# Patient Record
Sex: Female | Born: 1985 | Race: Black or African American | Hispanic: No | Marital: Single | Smoking: Former smoker
Health system: Southern US, Community
[De-identification: ages and names within clinical notes are randomized; demographics above are authoritative.]

## PROBLEM LIST (undated history)

## (undated) DIAGNOSIS — F319 Bipolar disorder, unspecified: Secondary | ICD-10-CM

## (undated) DIAGNOSIS — E669 Obesity, unspecified: Secondary | ICD-10-CM

## (undated) DIAGNOSIS — I10 Essential (primary) hypertension: Secondary | ICD-10-CM

## (undated) DIAGNOSIS — E66811 Obesity, class 1: Secondary | ICD-10-CM

## (undated) HISTORY — DX: Bipolar disorder, unspecified: F31.9

## (undated) HISTORY — DX: Essential (primary) hypertension: I10

---

## 2012-03-18 ENCOUNTER — Emergency Department: Payer: Self-pay | Admitting: Emergency Medicine

## 2012-05-28 ENCOUNTER — Emergency Department: Payer: Self-pay | Admitting: Emergency Medicine

## 2015-09-17 ENCOUNTER — Encounter: Payer: Self-pay | Admitting: Family Medicine

## 2015-09-17 ENCOUNTER — Ambulatory Visit (INDEPENDENT_AMBULATORY_CARE_PROVIDER_SITE_OTHER): Payer: BLUE CROSS/BLUE SHIELD | Admitting: Family Medicine

## 2015-09-17 VITALS — BP 140/92 | HR 76 | Ht 66.25 in | Wt 389.2 lb

## 2015-09-17 DIAGNOSIS — Z0000 Encounter for general adult medical examination without abnormal findings: Secondary | ICD-10-CM

## 2015-09-17 DIAGNOSIS — R030 Elevated blood-pressure reading, without diagnosis of hypertension: Secondary | ICD-10-CM

## 2015-09-17 DIAGNOSIS — IMO0001 Reserved for inherently not codable concepts without codable children: Secondary | ICD-10-CM

## 2015-09-17 DIAGNOSIS — F329 Major depressive disorder, single episode, unspecified: Secondary | ICD-10-CM

## 2015-09-17 DIAGNOSIS — M722 Plantar fascial fibromatosis: Secondary | ICD-10-CM | POA: Diagnosis not present

## 2015-09-17 DIAGNOSIS — Z124 Encounter for screening for malignant neoplasm of cervix: Secondary | ICD-10-CM | POA: Diagnosis not present

## 2015-09-17 DIAGNOSIS — F32A Depression, unspecified: Secondary | ICD-10-CM

## 2015-09-17 LAB — POCT URINALYSIS DIPSTICK
Bilirubin, UA: NEGATIVE
Blood, UA: NEGATIVE
GLUCOSE UA: NEGATIVE
KETONES UA: NEGATIVE
Leukocytes, UA: NEGATIVE
Nitrite, UA: NEGATIVE
Protein, UA: NEGATIVE
SPEC GRAV UA: 1.015
Urobilinogen, UA: NEGATIVE
pH, UA: 7.5

## 2015-09-17 NOTE — Patient Instructions (Signed)
As discussed, we will refer you to Gynecology for pap smear. Continue seeing your nutritionist. For your foot, use ice and stretching and anti-inflamatories as discussed as this appear to be plantar fascitis.   Preventative Care for Adults - Female      MAINTAIN REGULAR HEALTH EXAMS:  A routine yearly physical is a good way to check in with your primary care provider about your health and preventive screening. It is also an opportunity to share updates about your health and any concerns you have, and receive a thorough all-over exam.   Most health insurance companies pay for at least some preventative services.  Check with your health plan for specific coverages.  WHAT PREVENTATIVE SERVICES DO WOMEN NEED?  Adult women should have their weight and blood pressure checked regularly.   Women age 27 and older should have their cholesterol levels checked regularly.  Women should be screened for cervical cancer with a Pap smear and pelvic exam beginning at either age 27, or 3 years after they become sexually activity.    Breast cancer screening generally begins at age 27 with a mammogram and breast exam by your primary care provider.    Beginning at age 650 and continuing to age 27, women should be screened for colorectal cancer.  Certain people may need continued testing until age 27.  Updating vaccinations is part of preventative care.  Vaccinations help protect against diseases such as the flu.  Osteoporosis is a disease in which the bones lose minerals and strength as we age. Women ages 5465 and over should discuss this with their caregivers, as should women after menopause who have other risk factors.  Lab tests are generally done as part of preventative care to screen for anemia and blood disorders, to screen for problems with the kidneys and liver, to screen for bladder problems, to check blood sugar, and to check your cholesterol level.  Preventative services generally include counseling  about diet, exercise, avoiding tobacco, drugs, excessive alcohol consumption, and sexually transmitted infections.    GENERAL RECOMMENDATIONS FOR GOOD HEALTH:  Healthy diet:  Eat a variety of foods, including fruit, vegetables, animal or vegetable protein, such as meat, fish, chicken, and eggs, or beans, lentils, tofu, and grains, such as rice.  Drink plenty of water daily.  Decrease saturated fat in the diet, avoid lots of red meat, processed foods, sweets, fast foods, and fried foods.  Exercise:  Aerobic exercise helps maintain good heart health. At least 30-40 minutes of moderate-intensity exercise is recommended. For example, a brisk walk that increases your heart rate and breathing. This should be done on most days of the week.   Find a type of exercise or a variety of exercises that you enjoy so that it becomes a part of your daily life.  Examples are running, walking, swimming, water aerobics, and biking.  For motivation and support, explore group exercise such as aerobic class, spin class, Zumba, Yoga,or  martial arts, etc.    Set exercise goals for yourself, such as a certain weight goal, walk or run in a race such as a 5k walk/run.  Speak to your primary care provider about exercise goals.  Disease prevention:  If you smoke or chew tobacco, find out from your caregiver how to quit. It can literally save your life, no matter how long you have been a tobacco user. If you do not use tobacco, never begin.   Maintain a healthy diet and normal weight. Increased weight leads to problems with  blood pressure and diabetes.   The Body Mass Index or BMI is a way of measuring how much of your body is fat. Having a BMI above 27 increases the risk of heart disease, diabetes, hypertension, stroke and other problems related to obesity. Your caregiver can help determine your BMI and based on it develop an exercise and dietary program to help you achieve or maintain this important measurement at a  healthful level.  High blood pressure causes heart and blood vessel problems.  Persistent high blood pressure should be treated with medicine if weight loss and exercise do not work.   Fat and cholesterol leaves deposits in your arteries that can block them. This causes heart disease and vessel disease elsewhere in your body.  If your cholesterol is found to be high, or if you have heart disease or certain other medical conditions, then you may need to have your cholesterol monitored frequently and be treated with medication.   Ask if you should have a cardiac stress test if your history suggests this. A stress test is a test done on a treadmill that looks for heart disease. This test can find disease prior to there being a problem.  Menopause can be associated with physical symptoms and risks. Hormone replacement therapy is available to decrease these. You should talk to your caregiver about whether starting or continuing to take hormones is right for you.   Osteoporosis is a disease in which the bones lose minerals and strength as we age. This can result in serious bone fractures. Risk of osteoporosis can be identified using a bone density scan. Women ages 63 and over should discuss this with their caregivers, as should women after menopause who have other risk factors. Ask your caregiver whether you should be taking a calcium supplement and Vitamin D, to reduce the rate of osteoporosis.   Avoid drinking alcohol in excess (more than two drinks per day).  Avoid use of street drugs. Do not share needles with anyone. Ask for professional help if you need assistance or instructions on stopping the use of alcohol, cigarettes, and/or drugs.  Brush your teeth twice a day with fluoride toothpaste, and floss once a day. Good oral hygiene prevents tooth decay and gum disease. The problems can be painful, unattractive, and can cause other health problems. Visit your dentist for a routine oral and dental check  up and preventive care every 6-12 months.   Look at your skin regularly.  Use a mirror to look at your back. Notify your caregivers of changes in moles, especially if there are changes in shapes, colors, a size larger than a pencil eraser, an irregular border, or development of new moles.  Safety:  Use seatbelts 100% of the time, whether driving or as a passenger.  Use safety devices such as hearing protection if you work in environments with loud noise or significant background noise.  Use safety glasses when doing any work that could send debris in to the eyes.  Use a helmet if you ride a bike or motorcycle.  Use appropriate safety gear for contact sports.  Talk to your caregiver about gun safety.  Use sunscreen with a SPF (or skin protection factor) of 15 or greater.  Lighter skinned people are at a greater risk of skin cancer. Don't forget to also wear sunglasses in order to protect your eyes from too much damaging sunlight. Damaging sunlight can accelerate cataract formation.   Practice safe sex. Use condoms. Condoms are used for birth  control and to help reduce the spread of sexually transmitted infections (or STIs).  Some of the STIs are gonorrhea (the clap), chlamydia, syphilis, trichomonas, herpes, HPV (human papilloma virus) and HIV (human immunodeficiency virus) which causes AIDS. The herpes, HIV and HPV are viral illnesses that have no cure. These can result in disability, cancer and death.   Keep carbon monoxide and smoke detectors in your home functioning at all times. Change the batteries every 6 months or use a model that plugs into the wall.   Vaccinations:  Stay up to date with your tetanus shots and other required immunizations. You should have a booster for tetanus every 10 years. Be sure to get your flu shot every year, since 5%-20% of the [place] population comes down with the flu. The flu vaccine changes each year, so being vaccinated once is not enough. Get your shot in the fall,  before the flu season peaks.   Other vaccines to consider:  Human Papilloma Virus or HPV causes cancer of the cervix, and other infections that can be transmitted from person to person. There is a vaccine for HPV, and females should get immunized between the ages of 6 and 36. It requires a series of 3 shots.   Pneumococcal vaccine to protect against certain types of pneumonia.  This is normally recommended for adults age 53 or older.  However, adults younger than 27 years old with certain underlying conditions such as diabetes, heart or lung disease should also receive the vaccine.  Shingles vaccine to protect against Varicella Zoster if you are older than age 45, or younger than 27 years old with certain underlying illness.  Hepatitis A vaccine to protect against a form of infection of the liver by a virus acquired from food.  Hepatitis B vaccine to protect against a form of infection of the liver by a virus acquired from blood or body fluids, particularly if you work in health care.  If you plan to travel internationally, check with your local health department for specific vaccination recommendations.  Cancer Screening:  Breast cancer screening is essential to preventive care for women. All women age 12 and older should perform a breast self-exam every month. At age 30 and older, women should have their caregiver complete a breast exam each year. Women at ages 60 and older should have a mammogram (x-ray film) of the breasts. Your caregiver can discuss how often you need mammograms.    Cervical cancer screening includes taking a Pap smear (sample of cells examined under a microscope) from the cervix (end of the uterus). It also includes testing for HPV (Human Papilloma Virus, which can cause cervical cancer). Screening and a pelvic exam should begin at age 60, or 3 years after a woman becomes sexually active. Screening should occur every year, with a Pap smear but no HPV testing, up to age 84.  After age 51, you should have a Pap smear every 3 years with HPV testing, if no HPV was found previously.   Most routine colon cancer screening begins at the age of 51. On a yearly basis, doctors may provide special easy to use take-home tests to check for hidden blood in the stool. Sigmoidoscopy or colonoscopy can detect the earliest forms of colon cancer and is life saving. These tests use a small camera at the end of a tube to directly examine the colon. Speak to your caregiver about this at age 43, when routine screening begins (and is repeated every 5 years unless  early forms of pre-cancerous polyps or small growths are found).

## 2015-09-17 NOTE — Progress Notes (Signed)
Subjective:    Patient ID: Dorothy Pierce, female    DOB: [birth year], 29 y.o.   MRN: [id number]  HPI She is new to the practice and here to establish primary care. She has not had a physical exam in at least 2 years. She states she knows she is obese- signed up for nutritionist at [facility], first appt next week. Uses [employer] and has fitbit. Trying to work on calorie intake. Walking and doing treadmill.  Denies ever having high blood pressure.  Has Bipolar disorder, diagnosed in 2012 by [facility] provider. States she has more depression than mania. Has taken Lamictal in past and stopped in 2014 because she didn't like the way it made her feel. Has seen a counselor in the past but no longer. States she takes [employer] and is stable. She states she is able to control mood swings, knows when they are coming on.  Complains of 1 week history right foot pain when she gets up in morning and starts walking on it and eases up throughout the day. She has been taking Tylenol 2-3 pills daily for pain and this is helping. Reports similar problem 2 years ago and was diagnosed with plantar fascitis.  No other complaints.  Pap smear- 3-4 years ago and normal per patient. LMP: Oct 3. Regular and lasts 2 1/2-3 days, not heavy. Has cramping and sweats, no nausea, vomiting.  Never been pregnant.  Has had 1 sexual partner this year. She has same sex partner.   Dentist regular Eyes: no glasses or lenses. Wants to make appointment.  Immunizations- not on file, thinks last tetanus 7 years ago. Does not get flu shot.   Review of Systems Review of Systems Constitutional: -fever, -chills, -sweats, -unexpected weight change,-fatigue ENT: -runny nose, -ear pain, -sore throat Cardiology:  -chest pain, -palpitations, -edema Respiratory: -cough, -shortness of breath, -wheezing Gastroenterology: -abdominal pain, -nausea, -vomiting, -diarrhea, -constipation  Hematology: -bleeding or bruising  problems Musculoskeletal: -arthralgias, -myalgias, -joint swelling, -back pain Ophthalmology: -vision changes Urology: -dysuria, -difficulty urinating, -hematuria, -urinary frequency, -urgency Neurology: -headache, -weakness, -tingling, -numbness       Objective:   Physical Exam  BP 140/92 mmHg  Pulse 76  Ht 5' 6.25" (1.683 m)  Wt 389 lb 3.2 oz (176.54 kg)  BMI 62.33 kg/m2  LMP 09/07/2015  General Appearance:    Alert, cooperative, no distress, appears stated age  Head:    Normocephalic, without obvious abnormality, atraumatic  Eyes:    PERRL, conjunctiva/corneas clear, EOM's intact, fundi    benign  Ears:    Normal TM's and external ear canals  Nose:   Nares normal, mucosa normal, no drainage or sinus   tenderness  Throat:   Lips, mucosa, and tongue normal; teeth and gums normal  Neck:   Supple, no lymphadenopathy;  thyroid:  no   enlargement/tenderness/nodules; no carotid   bruit    Back:    Spine nontender, no curvature, ROM normal, no CVA     tenderness  Lungs:     Clear to auscultation bilaterally without wheezes, rales or     ronchi; respirations unlabored  Chest Wall:    No tenderness or deformity   Heart:    Regular rate and rhythm, S1 and S2 normal, no murmur, rub   or gallop  Breast Exam:    No tenderness, masses, or nipple discharge or inversion.      No axillary lymphadenopathy  Abdomen:     Soft, non-tender, nondistended, normoactive bowel  sounds,    no masses, no hepatosplenomegaly  Genitalia:    Normal external genitalia without lesions.  BUS and vagina normal;  No abnormal vaginal discharge.  Uterus and adnexa not enlarged, nontender, no masses.  Pap- unable to perform, could not visualize cervix.  Rectal:    Not performed due to age<40 and no related complaints  Extremities:   No clubbing, cyanosis or edema  Pulses:   2+ and symmetric all extremities  Skin:   Skin color, texture, turgor normal, no rashes or lesions  Lymph nodes:   Cervical, supraclavicular,  and axillary nodes normal  Neurologic:   CNII-XII intact, normal strength, sensation and gait; reflexes 2+ and symmetric throughout          Psych:   Normal mood, affect, hygiene and grooming.     Urinalysis dipstick negative      Assessment & Plan:  Routine general medical examination at a health care facility - Plan: POCT urinalysis dipstick, Comprehensive metabolic panel, CBC with Differential/Platelet, TSH, Lipid panel, Visual acuity screening, CANCELED: Cytology - PAP  Morbid obesity, unspecified obesity type (HCC)  Screening for cervical cancer - Plan: Ambulatory referral to Gynecology, CANCELED: Cytology - PAP  Elevated blood pressure  Plantar fasciitis  Depression  She will be referred for a pap smear since I was unable to visualize her cervix with the largest speculum in the office. Discussed that we need to keep an eye on her blood pressure and encouraged her to return for a follow-up blood pressure check and fasting labs. Discussed in depth that her BMI places her in morbid obesity category. She has an appointment with a nutritionist and I strongly encouraged her to keep this appointment and to continue on her weight loss goal and to make sure she is getting a minimum of 150 minutes per week of physical activity. Discussed that we can refer her to a counselor if she would like. She states she feels stable currently but will let me know in the future if she decides to do this.  Instructions on plantar fasciitis and treatment given.

## 2015-09-20 ENCOUNTER — Telehealth: Payer: Self-pay | Admitting: Obstetrics and Gynecology

## 2015-09-20 ENCOUNTER — Other Ambulatory Visit: Payer: BLUE CROSS/BLUE SHIELD

## 2015-09-20 NOTE — Telephone Encounter (Signed)
Called and left a message for patient to call back to schedule a new patient doctor referral. °

## 2015-09-21 NOTE — Telephone Encounter (Signed)
Called and left a message for patient to call back to schedule a new patient doctor referral. °

## 2015-09-23 NOTE — Telephone Encounter (Signed)
Called and left a message for patient to call back to schedule a new patient doctor referral. °

## 2015-09-24 NOTE — Telephone Encounter (Signed)
Sending referral back to referring office due to patient not returning several calls to schedule.

## 2015-09-30 ENCOUNTER — Other Ambulatory Visit: Payer: BLUE CROSS/BLUE SHIELD

## 2015-09-30 ENCOUNTER — Encounter: Payer: Self-pay | Admitting: Family Medicine

## 2015-09-30 ENCOUNTER — Telehealth: Payer: Self-pay

## 2015-09-30 DIAGNOSIS — Z0000 Encounter for general adult medical examination without abnormal findings: Secondary | ICD-10-CM

## 2015-09-30 LAB — CBC WITH DIFFERENTIAL/PLATELET
BASOS ABS: 0 10*3/uL (ref 0.0–0.1)
BASOS PCT: 0 % (ref 0–1)
EOS ABS: 0.1 10*3/uL (ref 0.0–0.7)
Eosinophils Relative: 1 % (ref 0–5)
HCT: 36.6 % (ref 36.0–46.0)
Hemoglobin: 12.4 g/dL (ref 12.0–15.0)
Lymphocytes Relative: 30 % (ref 12–46)
Lymphs Abs: 2.8 10*3/uL (ref 0.7–4.0)
MCH: 31.9 pg (ref 26.0–34.0)
MCHC: 33.9 g/dL (ref 30.0–36.0)
MCV: 94.1 fL (ref 78.0–100.0)
MONOS PCT: 5 % (ref 3–12)
MPV: 9.1 fL (ref 8.6–12.4)
Monocytes Absolute: 0.5 10*3/uL (ref 0.1–1.0)
NEUTROS PCT: 64 % (ref 43–77)
Neutro Abs: 6 10*3/uL (ref 1.7–7.7)
PLATELETS: 318 10*3/uL (ref 150–400)
RBC: 3.89 MIL/uL (ref 3.87–5.11)
RDW: 13.3 % (ref 11.5–15.5)
WBC: 9.3 10*3/uL (ref 4.0–10.5)

## 2015-09-30 LAB — COMPREHENSIVE METABOLIC PANEL
ALBUMIN: 3.5 g/dL — AB (ref 3.6–5.1)
ALT: 15 U/L (ref 6–29)
AST: 13 U/L (ref 10–30)
Alkaline Phosphatase: 65 U/L (ref 33–115)
BILIRUBIN TOTAL: 0.6 mg/dL (ref 0.2–1.2)
BUN: 10 mg/dL (ref 7–25)
CALCIUM: 9.1 mg/dL (ref 8.6–10.2)
CHLORIDE: 106 mmol/L (ref 98–110)
CO2: 25 mmol/L (ref 20–31)
CREATININE: 0.76 mg/dL (ref 0.50–1.10)
Glucose, Bld: 84 mg/dL (ref 65–99)
Potassium: 4.1 mmol/L (ref 3.5–5.3)
SODIUM: 140 mmol/L (ref 135–146)
TOTAL PROTEIN: 7 g/dL (ref 6.1–8.1)

## 2015-09-30 LAB — LIPID PANEL
CHOLESTEROL: 129 mg/dL (ref 125–200)
HDL: 43 mg/dL — AB (ref >=46)
LDL Cholesterol: 76 mg/dL (ref <130)
Total CHOL/HDL Ratio: 3 Ratio (ref <=5.0)
Triglycerides: 52 mg/dL (ref <150)
VLDL: 10 mg/dL (ref <30)

## 2015-09-30 LAB — TSH: TSH: 1.652 u[IU]/mL (ref 0.350–4.500)

## 2015-09-30 NOTE — Telephone Encounter (Signed)
Pt came for lab visit and had BP checked before she left, it was high: 146/96

## 2015-09-30 NOTE — Telephone Encounter (Signed)
Please call her and tell her that her blood pressure was high today at her visit. Also, see if she has a blood pressure cuff at home. If not, I recommend that she get a good one and keep an eye on her blood pressure at home. Please give her instructions on how to take her BP at home. * I would like for her to let me know if her blood pressures are higher than 140/90*. In the meanwhile, please have her watch her salt and fried food intake. Have her follow up with me if her home blood pressures are elevated. It may be time to start her on a medication for this.  Thanks.

## 2015-09-30 NOTE — Telephone Encounter (Signed)
Left message for pt to call me back  Pt needs an appt for her blood pressure per vickie

## 2015-10-01 NOTE — Telephone Encounter (Signed)
Left message for pt to call back and schedule an appt for her high blood pressure with vickie

## 2015-10-14 ENCOUNTER — Encounter: Payer: Self-pay | Admitting: Internal Medicine

## 2015-12-09 ENCOUNTER — Ambulatory Visit (INDEPENDENT_AMBULATORY_CARE_PROVIDER_SITE_OTHER): Payer: BLUE CROSS/BLUE SHIELD | Admitting: Family Medicine

## 2015-12-09 ENCOUNTER — Encounter: Payer: Self-pay | Admitting: Family Medicine

## 2015-12-09 ENCOUNTER — Ambulatory Visit
Admission: RE | Admit: 2015-12-09 | Discharge: 2015-12-09 | Disposition: A | Discharge status: Home / Self Care | Payer: BLUE CROSS/BLUE SHIELD | Source: Ambulatory Visit | Attending: Family Medicine | Admitting: Family Medicine

## 2015-12-09 VITALS — BP 140/90 | HR 64 | Ht 66.25 in | Wt 399.2 lb

## 2015-12-09 DIAGNOSIS — R079 Chest pain, unspecified: Secondary | ICD-10-CM

## 2015-12-09 DIAGNOSIS — R0601 Orthopnea: Secondary | ICD-10-CM

## 2015-12-09 DIAGNOSIS — I10 Essential (primary) hypertension: Secondary | ICD-10-CM | POA: Diagnosis not present

## 2015-12-09 MED ORDER — HYDROCHLOROTHIAZIDE 12.5 MG PO TABS: 12.5000 mg | ORAL_TABLET | Freq: Every day | ORAL | Status: DC

## 2015-12-09 MED ORDER — ESOMEPRAZOLE MAGNESIUM 20 MG PO CPDR: 20.0000 mg | DELAYED_RELEASE_CAPSULE | Freq: Every day | ORAL | Status: DC

## 2015-12-09 NOTE — Progress Notes (Signed)
Subjective:    Patient ID: Dorothy Pierce, female    DOB: 04-20-1986, 30 y.o.   MRN: [id number]  HPI Chief Complaint  Patient presents with  . upper chest pain    upper right chest pain since tuesday. was having irrialation on other side but then moved to right side   She is here with reports of non radiating chest pain that she describes as a "scratchy" sensation for past 2 nights. She denies having pain at present. Pain does not stay in one spot, sometimes it is on the right side and other times on the left. Pain is only present when she lays down and it occasionally wakes her up at night. Pain lasts until she gets up and then goes away when she is upright and moving around. She states she has also been feeling uncomfortable when laying flat and states she feels like she has a hard time getting a deep breath so she has been propping herself up on 2 pillows. She denies having pain or shortness of breath during the daytime.  States swelling to bilateral ankles that get worse at the end of the day and then goes away completely over night.  Denies palpitations, heart racing, DOE, fever, chills, cough, nausea, vomiting, diarrhea.  She is concerned about weight gain, she has gained 10 lbs since last visit in October. States she has been eating more because of being stressed. 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 She is no longer in a relationship, states she and her partner broke up.  She did not go for pap smear as recommended.   She graduating this month from [employer] with [employer].    Review of Systems Pertinent positives and negatives in the history of present illness.    Objective:   Physical Exam  Constitutional: She is oriented to person, place, and time. She appears well-developed and well-nourished. No distress.  Morbidly obese  Neck: Normal range of motion and full passive range of motion without pain. Neck supple. No JVD present. Carotid bruit is not present.  Cardiovascular: Normal  rate, regular rhythm, normal heart sounds and intact distal pulses.  Exam reveals no gallop and no friction rub.   No murmur heard. No LE edema bilaterally  Pulmonary/Chest: Effort normal and breath sounds normal. She exhibits no tenderness and no swelling.  Lymphadenopathy:  No head, cervical or supraclavicular adenopathy  Neurological: She is alert and oriented to person, place, and time. Gait normal.  Skin: Skin is warm and dry. No rash noted. No cyanosis. No pallor.  Psychiatric: She has a normal mood and affect. Her speech is normal and behavior is normal. Judgment and thought content normal. Cognition and memory are normal.   BP 140/84 mmHg  Pulse 64  Ht 5' 6.25" (1.683 m)  Wt 399 lb 3.2 oz (181.076 kg)  BMI 63.93 kg/m2  LMP 11/08/2015   EKG- NSR     Assessment & Plan:  Nonspecific chest pain - Plan: EKG 12-Lead, DG Chest 2 View  Morbid obesity, unspecified obesity type (HCC) - Plan: Amb ref to Medical Nutrition Therapy-MNT  Orthopnea - Plan: DG Chest 2 View  Essential hypertension - Plan: hydrochlorothiazide (HYDRODIURIL) 12.5 MG tablet, Amb ref to Medical Nutrition Therapy-MNT  Discussed patient with Dr Susann Givens. Chest pain appears non specific and only occurs when lying flat which speaks to possible reflux. Samples of Nexium given with instructions to see if she notices a difference. Discussed increase in weight and that her BMI places  her in severely obese category. Referral made to nutritionist. Discussed in depth lifestyle changes and finding a different way of dealing with her stress than eating such as taking 10 deep breaths, calling a friend, drinking water, etc. Will consider medication such as Contrave after she has seen nutritionist. Discussed walking 10-15 minutes 2 times daily. 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  Blood pressure has been elevated on 2 separate encounters and today at the beginning and end of appointment. She did not follow  up 3 months ago for blood pressure as requested. Started her on HCTZ today and discussed using caution for the first few days when taking it until she sees how her body reacts to it. Encouraged her to check her blood pressure daily for next week. She will follow up next week for blood pressure check and to see how she is doing on new medication.  Discussed DASH diet and written information provided.  Recommend that she schedule with Gynecologist to have pap smear. I was unable to reach her cervix with our speculums due to her body habitus.

## 2015-12-09 NOTE — Patient Instructions (Addendum)
Try the Nexium one pill once daily and take it 30 minutes before eating. Make an appointment to get your Pap smear. You should hear from the nutritionist in the next few days if you have not heard from them let me know. Return in 1 week for a blood pressure check.  DASH Eating Plan DASH stands for "Dietary Approaches to Stop Hypertension." The DASH eating plan is a healthy eating plan that has been shown to reduce high blood pressure (hypertension). Additional health benefits may include reducing the risk of type 2 diabetes mellitus, heart disease, and stroke. The DASH eating plan may also help with weight loss. WHAT DO I NEED TO KNOW ABOUT THE DASH EATING PLAN? For the DASH eating plan, you will follow these general guidelines:  Choose foods with a percent daily value for sodium of less than 5% (as listed on the food label).  Use salt-free seasonings or herbs instead of table salt or sea salt.  Check with your health care provider or pharmacist before using salt substitutes.  Eat lower-sodium products, often labeled as "lower sodium" or "no salt added."  Eat fresh foods.  Eat more vegetables, fruits, and low-fat dairy products.  Choose whole grains. Look for the word "whole" as the first word in the ingredient list.  Choose fish and skinless chicken or [place] more often than red meat. Limit fish, poultry, and meat to 6 oz (170 g) each day.  Limit sweets, desserts, sugars, and sugary drinks.  Choose heart-healthy fats.  Limit cheese to 1 oz (28 g) per day.  Eat more home-cooked food and less restaurant, buffet, and fast food.  Limit fried foods.  Cook foods using methods other than frying.  Limit canned vegetables. If you do use them, rinse them well to decrease the sodium.  When eating at a restaurant, ask that your food be prepared with less salt, or no salt if possible. WHAT FOODS CAN I EAT? Seek help from a dietitian for individual calorie needs. Grains Whole grain or  whole wheat bread. Brown rice. Whole grain or whole wheat pasta. Quinoa, bulgur, and whole grain cereals. Low-sodium cereals. Corn or whole wheat flour tortillas. Whole grain cornbread. Whole grain crackers. Low-sodium crackers. Vegetables Fresh or frozen vegetables (raw, steamed, roasted, or grilled). Low-sodium or reduced-sodium tomato and vegetable juices. Low-sodium or reduced-sodium tomato sauce and paste. Low-sodium or reduced-sodium canned vegetables.  Fruits All fresh, canned (in natural juice), or frozen fruits. Meat and Other Protein Products Ground beef (85% or leaner), grass-fed beef, or beef trimmed of fat. Skinless chicken or [place]. Ground chicken or [place]. Pork trimmed of fat. All fish and seafood. Eggs. Dried beans, peas, or lentils. Unsalted nuts and seeds. Unsalted canned beans. Dairy Low-fat dairy products, such as skim or 1% milk, 2% or reduced-fat cheeses, low-fat ricotta or cottage cheese, or plain low-fat yogurt. Low-sodium or reduced-sodium cheeses. Fats and Oils Tub margarines without trans fats. Light or reduced-fat mayonnaise and salad dressings (reduced sodium). Avocado. Safflower, olive, or canola oils. Natural peanut or almond butter. Other Unsalted popcorn and pretzels. The items listed above may not be a complete list of recommended foods or beverages. Contact your dietitian for more options. WHAT FOODS ARE NOT RECOMMENDED? Grains White bread. White pasta. White rice. Refined cornbread. Bagels and croissants. Crackers that contain trans fat. Vegetables Creamed or fried vegetables. Vegetables in a cheese sauce. Regular canned vegetables. Regular canned tomato sauce and paste. Regular tomato and vegetable juices. Fruits Dried fruits. Canned fruit in light  or heavy syrup. Fruit juice. Meat and Other Protein Products Fatty cuts of meat. Ribs, chicken wings, bacon, sausage, bologna, salami, chitterlings, fatback, hot dogs, bratwurst, and packaged luncheon meats.  Salted nuts and seeds. Canned beans with salt. Dairy Whole or 2% milk, cream, half-and-half, and cream cheese. Whole-fat or sweetened yogurt. Full-fat cheeses or blue cheese. Nondairy creamers and whipped toppings. Processed cheese, cheese spreads, or cheese curds. Condiments Onion and garlic salt, seasoned salt, table salt, and sea salt. Canned and packaged gravies. Worcestershire sauce. Tartar sauce. Barbecue sauce. Teriyaki sauce. Soy sauce, including reduced sodium. Steak sauce. Fish sauce. Oyster sauce. Cocktail sauce. Horseradish. Ketchup and mustard. Meat flavorings and tenderizers. Bouillon cubes. Hot sauce. Tabasco sauce. Marinades. Taco seasonings. Relishes. Fats and Oils Butter, stick margarine, lard, shortening, ghee, and bacon fat. Coconut, palm kernel, or palm oils. Regular salad dressings. Other Pickles and olives. Salted popcorn and pretzels. The items listed above may not be a complete list of foods and beverages to avoid. Contact your dietitian for more information. WHERE CAN I FIND MORE INFORMATION? National Heart, Lung, and Blood Institute: CableProm[employer].itwww.nhlbi.nih.gov/health/health-topics/topics/dash/   This information is not intended to replace advice given to you by your health care provider. Make sure you discuss any questions you have with your health care provider.   Document Released: 11/09/2011 Document Revised: 12/11/2014 Document Reviewed: 09/24/2013 Elsevier Interactive Patient Education [employer]2016 Elsevier Inc.

## 2015-12-15 ENCOUNTER — Encounter: Payer: Self-pay | Admitting: Family Medicine

## 2015-12-15 ENCOUNTER — Ambulatory Visit (INDEPENDENT_AMBULATORY_CARE_PROVIDER_SITE_OTHER): Payer: BLUE CROSS/BLUE SHIELD | Admitting: Family Medicine

## 2015-12-15 VITALS — BP 132/90 | HR 68 | Wt 391.4 lb

## 2015-12-15 DIAGNOSIS — I10 Essential (primary) hypertension: Secondary | ICD-10-CM | POA: Insufficient documentation

## 2015-12-15 DIAGNOSIS — K219 Gastro-esophageal reflux disease without esophagitis: Secondary | ICD-10-CM | POA: Insufficient documentation

## 2015-12-15 NOTE — Patient Instructions (Signed)
Keep up the good work with eating healthy and walking daily. Return in 1 month to recheck blood pressure.

## 2015-12-15 NOTE — Progress Notes (Signed)
   Subjective:    Patient ID: Dorothy Pierce, female    DOB: 03/24/1986, 30 y.o.   MRN: [id number]  HPI Chief Complaint  Patient presents with  . follow-up    follow-up on bp.    She is here for follow up on blood pressure. She is taking hctz daily and reports she is feeling fine on it but does notice urinating slightly more often. States chest pain she was experiencing prior to last visit has totally gone away and she attributes this to the Nexium for GERD symptoms.  she would like to take Zantac and see if reflux is controlled with this medication. She has an appointment with the nutritionist on February 3.  Has lost weight since last visit, approximately 8 lbs and has been walking at least 30 minutes per day and drinking plenty of water.  Has not scheduled an appointment yet to have pap smear. I was unable to visualize her cervix with our largest speculum due to body habitus. Last pap smear 4 years ago per patient.    Review of Systems Pertinent positives and negatives in the history of present illness.    Objective:   Physical Exam BP 132/90 mmHg  Pulse 68  Wt 391 lb 6.4 oz (177.538 kg)  LMP 11/08/2015  Alert and oriented and in no acute distress. Not otherwise examined.       Assessment & Plan:  Essential hypertension  Obesity, morbid, BMI 50 or higher (HCC)  Gastroesophageal reflux disease, esophagitis presence not specified  Congratulated her on the weight loss and encouraged her to continue being physically active and eating healthy. She will continue to watch her diet for offending foods that trigger her GERD and take over the counter Zantac. She will let me know if this is not working.  Strongly encouraged her to keep the appointment with the nutritionist as scheduled.  Will follow up in 2 weeks to see how she is doing with blood pressure. If blood pressure continues to elevated and not at goal <140//90, I will consider adding a second medication such as amlodipine.    DASH diet discussed again.  I advised her to schedule appointment with gynecologist for pap smear since I do not have proper equipment to do this in my office due to her current body habitus.

## 2016-01-07 ENCOUNTER — Ambulatory Visit: Payer: BLUE CROSS/BLUE SHIELD | Admitting: *Deleted

## 2017-04-25 ENCOUNTER — Encounter: Payer: Self-pay | Admitting: Family Medicine

## 2017-04-25 ENCOUNTER — Ambulatory Visit (INDEPENDENT_AMBULATORY_CARE_PROVIDER_SITE_OTHER): Payer: 59 | Admitting: Family Medicine

## 2017-04-25 VITALS — BP 130/90 | HR 70 | Wt 370.8 lb

## 2017-04-25 DIAGNOSIS — F4320 Adjustment disorder, unspecified: Secondary | ICD-10-CM | POA: Diagnosis not present

## 2017-04-25 DIAGNOSIS — F319 Bipolar disorder, unspecified: Secondary | ICD-10-CM | POA: Insufficient documentation

## 2017-04-25 DIAGNOSIS — F4321 Adjustment disorder with depressed mood: Secondary | ICD-10-CM

## 2017-04-25 DIAGNOSIS — I10 Essential (primary) hypertension: Secondary | ICD-10-CM | POA: Diagnosis not present

## 2017-04-25 LAB — CBC WITH DIFFERENTIAL/PLATELET
BASOS ABS: 0 {cells}/uL (ref 0–200)
Basophils Relative: 0 %
EOS ABS: 113 {cells}/uL (ref 15–500)
Eosinophils Relative: 1 %
HEMATOCRIT: 37.5 % (ref 35.0–45.0)
Hemoglobin: 12.3 g/dL (ref 11.7–15.5)
LYMPHS PCT: 31 %
Lymphs Abs: 3503 cells/uL (ref 850–3900)
MCH: 30.7 pg (ref 27.0–33.0)
MCHC: 32.8 g/dL (ref 32.0–36.0)
MCV: 93.5 fL (ref 80.0–100.0)
MONO ABS: 565 {cells}/uL (ref 200–950)
MONOS PCT: 5 %
MPV: 8.6 fL (ref 7.5–12.5)
NEUTROS PCT: 63 %
Neutro Abs: 7119 cells/uL (ref 1500–7800)
Platelets: 356 10*3/uL (ref 140–400)
RBC: 4.01 MIL/uL (ref 3.80–5.10)
RDW: 13.2 % (ref 11.0–15.0)
WBC: 11.3 10*3/uL — ABNORMAL HIGH (ref 4.0–10.5)

## 2017-04-25 MED ORDER — HYDROCHLOROTHIAZIDE 12.5 MG PO TABS: 12.5000 mg | ORAL_TABLET | Freq: Every day | ORAL | 2 refills | Status: DC

## 2017-04-25 NOTE — Progress Notes (Signed)
Subjective:    Patient ID: Dorothy Pierce, female    DOB: 1986-09-05, 31 y.o.   MRN: [id number]  HPI Chief Complaint  Patient presents with  . discuss medications    need a medication for bipolar. dx back in 2010 at [facility] and is now seeing a therapist.    States she is here because of her bipolar disorder, diagnosed in 2010 or 2012 by [facility] provider. States she is worried because her mood swings are getting worse. States she has more depression than mania. Has taken Lamictal in past and stopped in 2014 because she didn't like the way it made her feel. States she has tried Celexa and other medications that she cannot recall. States she has been seeing a Veterinary surgeon at [facility] on [address] once weekly. States she takes [employer] and this had helped control her mood swings previously.   Last saw a psychiatrist 3 years ago. She cannot recall the name.   States she has been dealing with the recent death of her grandmother.  She has had thoughts of suicide but does not today. Denies having a plan to hurt herself.  She has been smoking marijuana lately to self medicate. She also reports having thoughts of having sex with random people but denies actually doing this. Reports having both female in female partners in the past.  Reports not eating very often and having nausea at times.  States she is not drinking alcohol, has had a problem with alcohol in the past.  She is working. Is not currently in a relationship.   Denies auditory or visual hallucinations.  Denies fever, chills, headache, dizziness, confusion, chest pain, palpitations, cough, shortness of breath, abdominal pain, back pain, vomiting, diarrhea, vaginal discharge, urinary symptoms.    Depression screen [facility] 2/9 04/25/2017 04/25/2017 09/17/2015  Decreased Interest 2 2 1   Down, Depressed, Hopeless 2 2 1   PHQ - 2 Score 4 4 2   Altered sleeping 3 - 1  Tired, decreased energy 3 - 1  Change in appetite 3 - 1  Feeling bad or  failure about yourself  3 - 1  Trouble concentrating 3 - 1  Moving slowly or fidgety/restless 2 - 1  Suicidal thoughts 2 - 0  PHQ-9 Score 23 - 8   GAD 7 : Generalized Anxiety Score 04/25/2017  Nervous, Anxious, on Edge 2  Control/stop worrying 3  Worry too much - different things 3  Trouble relaxing 3  Restless 3  Easily annoyed or irritable 2  Afraid - awful might happen 2  Total GAD 7 Score 18  Anxiety Difficulty Somewhat difficult   Reviewed allergies, medications, past medical, surgical, family, and social history.    Review of Systems Pertinent positives and negatives in the history of present illness.     Objective:   Physical Exam  Constitutional: She is oriented to person, place, and time. She appears well-developed and well-nourished. No distress.  HENT:  Mouth/Throat: Uvula is midline, oropharynx is clear and moist and mucous membranes are normal.  Eyes: Conjunctivae and EOM are normal. Pupils are equal, round, and reactive to light.  Neck: Full passive range of motion without pain. Neck supple. No thyromegaly present.  Cardiovascular: Normal rate, regular rhythm and normal heart sounds.   Pulmonary/Chest: Effort normal and breath sounds normal.  Lymphadenopathy:    She has no cervical adenopathy.  Neurological: She is alert and oriented to person, place, and time. She has normal strength. No cranial nerve deficit or  sensory deficit. Coordination and gait normal.  Skin: Skin is warm and dry. No rash noted. No pallor.  Psychiatric: Her speech is normal and behavior is normal. Judgment and thought content normal. Cognition and memory are normal. She exhibits a depressed mood. She expresses no homicidal and no suicidal ideation. She expresses no suicidal plans and no homicidal plans.   BP 130/90   Pulse 70   Wt (!) 370 lb 12.8 oz (168.2 kg)   BMI 59.40 kg/m       Assessment & Plan:  Bipolar 1 disorder (HCC) - Plan: CBC with Differential/Platelet, Comprehensive  metabolic panel, TSH  Grief  Essential hypertension - Plan: CBC with Differential/Platelet, Comprehensive metabolic panel, hydrochlorothiazide (HYDRODIURIL) 12.5 MG tablet  PHQ9 and GAD7 both are in the severe category.  Discussed that she has a complex history of psychological issues and has tried multiple medications including some that has made her symptoms worse. I recommend that she see her psychiatrist from 3 years ago or call and schedule with a new one. Suggestions of who she may call were given to her.  She will continue with counseling.  Recommend that she stop self medicating with marijuana or any other mind altering drugs.  Verbal contract that she will not harm herself and that if she has thoughts of suicide that she will call 911 or [facility].  Will check labs.  She has not been taking her BP medication and has not been checking her BP at home. Will refill her HCTZ and have her start back on this.  She will follow up in 4 weeks or sooner if needed.  Spent at least 25 minutes face to face with patient and at least 50% was in counseling and coordination of care.

## 2017-04-25 NOTE — Patient Instructions (Addendum)
Take good care of yourself. Eat regular meals and stay well hydrated. Continue seeing your therapist.  Start back on your blood pressure medication.   If you need to see someone immediately go [facility] [address] or call 911.   You can call to schedule your appointment with the psychiatrist. A few offices are listed below for you to call.   [facility]  [address], [address], [place], [place] [address]  Phone: [phone]  [facility] [address] [place], [place] [address]  Phone: [phone]     Follow up with me in 4 weeks or sooner if needed.

## 2017-04-26 LAB — COMPREHENSIVE METABOLIC PANEL
ALBUMIN: 3.8 g/dL (ref 3.6–5.1)
ALK PHOS: 73 U/L (ref 33–115)
ALT: 14 U/L (ref 6–29)
AST: 16 U/L (ref 10–30)
BILIRUBIN TOTAL: 0.4 mg/dL (ref 0.2–1.2)
BUN: 7 mg/dL (ref 7–25)
CALCIUM: 9.1 mg/dL (ref 8.6–10.2)
CO2: 24 mmol/L (ref 20–31)
Chloride: 104 mmol/L (ref 98–110)
Creat: 0.77 mg/dL (ref 0.50–1.10)
Glucose, Bld: 80 mg/dL (ref 65–99)
Potassium: 3.9 mmol/L (ref 3.5–5.3)
Sodium: 138 mmol/L (ref 135–146)
TOTAL PROTEIN: 7.1 g/dL (ref 6.1–8.1)

## 2017-04-26 LAB — TSH: TSH: 2.29 m[IU]/L

## 2017-04-27 ENCOUNTER — Encounter: Payer: Self-pay | Admitting: Internal Medicine

## 2017-05-03 IMAGING — CR DG CHEST 2V
2 series · 2 of 2 positions shown · non-contrast
Comparison: None in PACs

CLINICAL DATA: Two days of chest pain, former smoker.

EXAM:
CHEST  2 VIEW

[w chest pa]
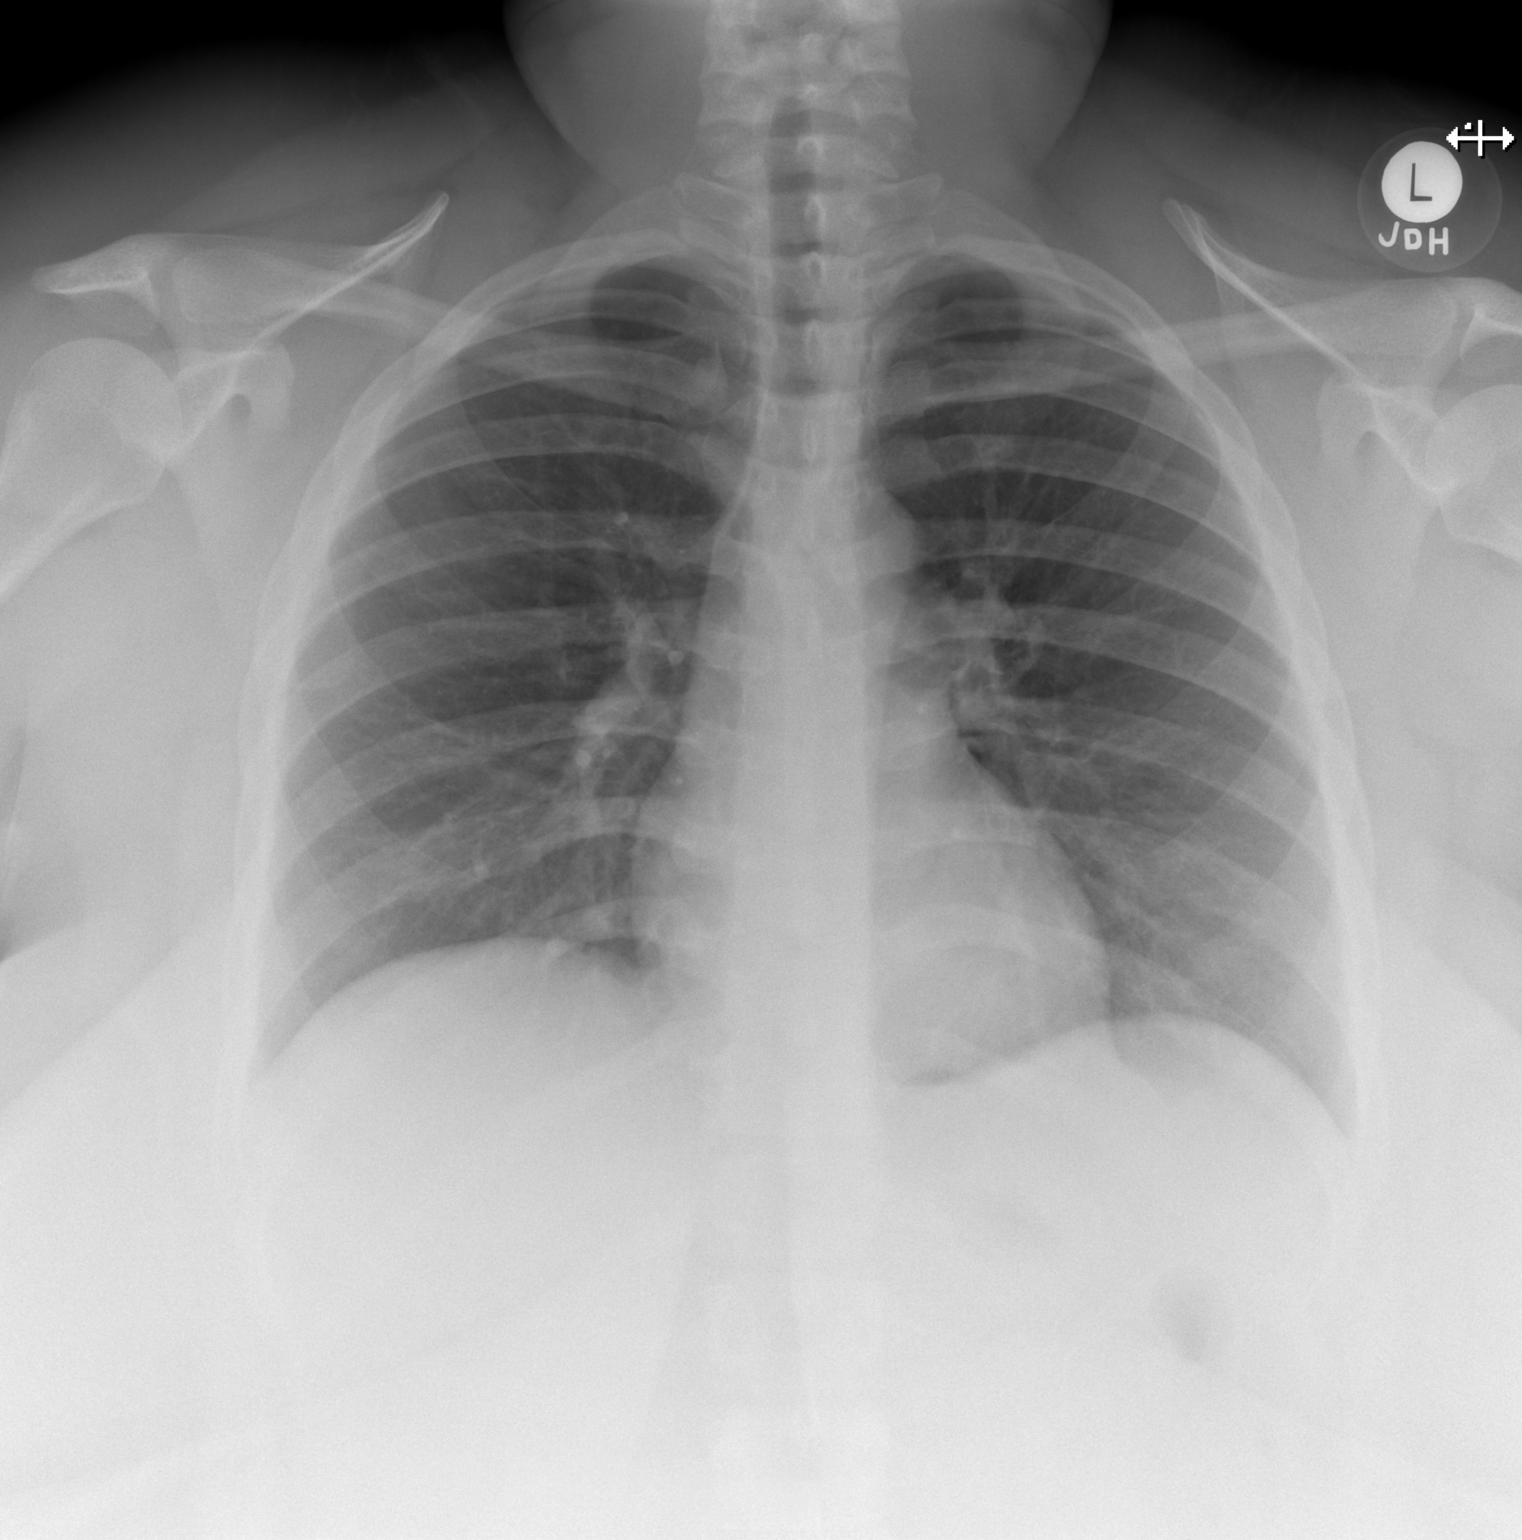

[w chest lat]
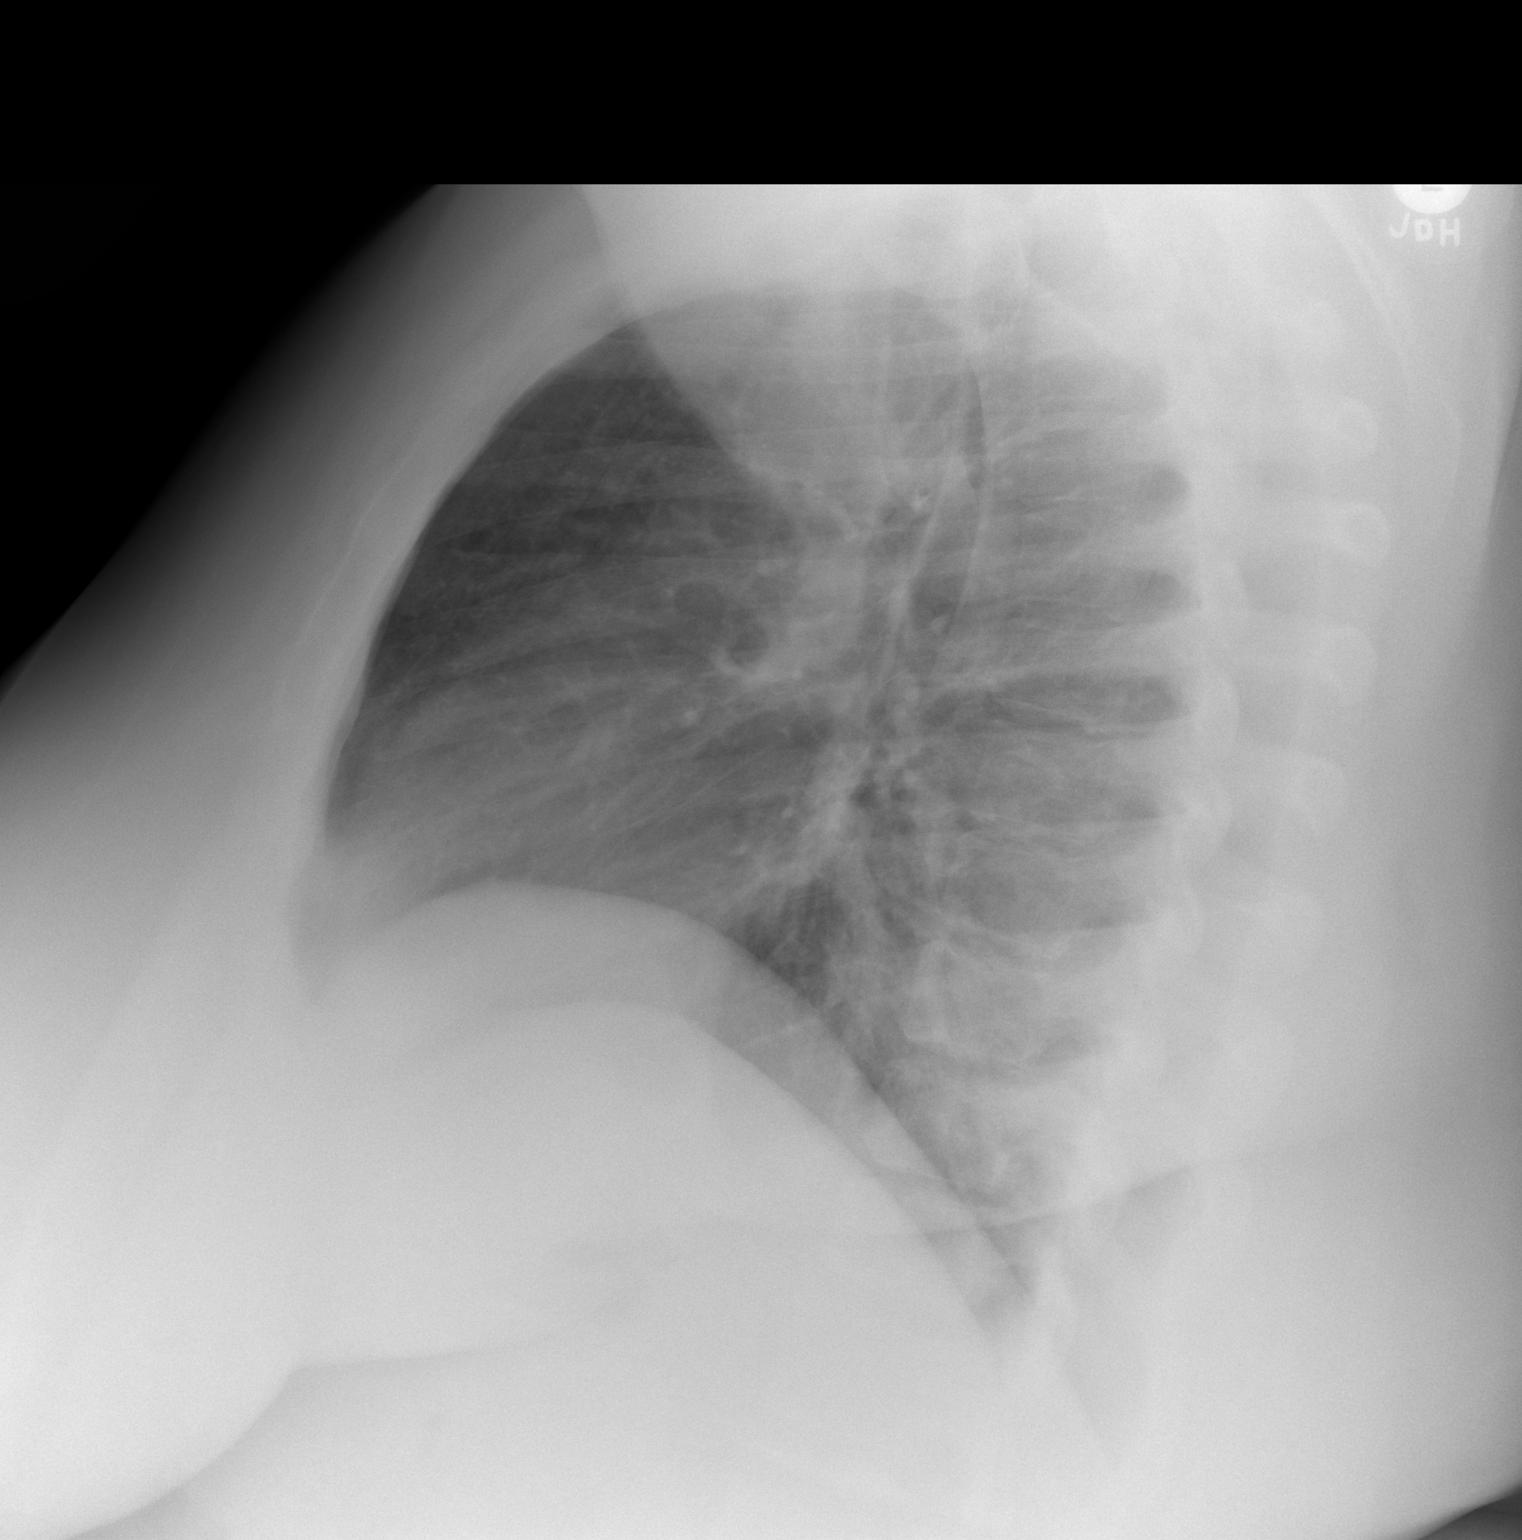

[2 of 2 positions shown; findings below may reference images not displayed]

FINDINGS: The lungs are adequately inflated. There is no focal infiltrate.
There is no pleural effusion. The heart and pulmonary vascularity
are normal. The mediastinum is normal in width. There is no pleural
effusion. The observed bony thorax is unremarkable.
IMPRESSION: There is no active cardiopulmonary disease.

## 2017-05-29 ENCOUNTER — Ambulatory Visit: Payer: 59 | Admitting: Family Medicine

## 2019-01-02 ENCOUNTER — Encounter (HOSPITAL_COMMUNITY): Payer: Self-pay

## 2019-01-02 ENCOUNTER — Other Ambulatory Visit: Payer: Self-pay

## 2019-01-02 ENCOUNTER — Ambulatory Visit (HOSPITAL_COMMUNITY)
Admission: EM | Admit: 2019-01-02 | Discharge: 2019-01-02 | Disposition: A | Discharge status: Home / Self Care | Payer: BLUE CROSS/BLUE SHIELD | Attending: Family Medicine | Admitting: Family Medicine

## 2019-01-02 DIAGNOSIS — M7918 Myalgia, other site: Secondary | ICD-10-CM

## 2019-01-02 DIAGNOSIS — R51 Headache: Secondary | ICD-10-CM | POA: Diagnosis not present

## 2019-01-02 DIAGNOSIS — R6889 Other general symptoms and signs: Secondary | ICD-10-CM | POA: Insufficient documentation

## 2019-01-02 DIAGNOSIS — R05 Cough: Secondary | ICD-10-CM | POA: Diagnosis not present

## 2019-01-02 DIAGNOSIS — J029 Acute pharyngitis, unspecified: Secondary | ICD-10-CM

## 2019-01-02 MED ORDER — OSELTAMIVIR PHOSPHATE 75 MG PO CAPS: 75.0000 mg | ORAL_CAPSULE | Freq: Two times a day (BID) | ORAL | 0 refills | Status: DC

## 2019-01-02 NOTE — ED Provider Notes (Signed)
[facility]    CSN: [id number] Arrival date & time: 01/02/19  1839     History   Chief Complaint Chief Complaint  Patient presents with  . Influenza    HPI Dorothy Pierce is a 33 y.o. female.   1 day history of fever severe myalgias cough sore throat headache.  Patient did not take flu shot.  HPI  Past Medical History:  Diagnosis Date  . Bipolar 1 disorder (HCC)   . HTN (hypertension)     Patient Active Problem List   Diagnosis Date Noted  . Bipolar 1 disorder (HCC)   . Hypertension 12/15/2015  . GERD (gastroesophageal reflux disease) 12/15/2015  . Obesity, morbid, BMI 50 or higher (HCC) 09/30/2015    History reviewed. No pertinent surgical history.  OB History   No obstetric history on file.      Home Medications    Prior to Admission medications   Medication Sig Start Date End Date Taking? Authorizing Provider  hydrochlorothiazide (HYDRODIURIL) 12.5 MG tablet Take 1 tablet (12.5 mg total) by mouth daily. 04/25/17   Avanell ShackletonHenson, Vickie L, NP-C    Family History Family History  Problem Relation Age of Onset  . Hypertension Mother   . Depression Mother   . Hyperlipidemia Mother   . Thyroid disease Mother   . Allergies Sister   . Depression Sister   . Asthma Sister   . Hypertension Maternal Aunt   . Hyperlipidemia Maternal Aunt   . Arthritis Maternal Aunt   . Cancer Maternal Aunt        skin/ breast  . Hypertension Maternal Uncle   . Hyperlipidemia Maternal Uncle   . Hypertension Maternal Grandmother   . Hyperlipidemia Maternal Grandmother   . Asthma Maternal Grandmother   . Diabetes Maternal Grandmother   . Hypertension Maternal Grandfather   . Hyperlipidemia Maternal Grandfather   . Diabetes Maternal Grandfather     Social History Social History   Tobacco Use  . Smoking status: Former Games developermoker  . Smokeless tobacco: Never Used  Substance Use Topics  . Alcohol use: Yes    Alcohol/week: 0.0 standard drinks    Comment: rarely.  history of addiction  . Drug use: Yes    Types: Marijuana    Comment: former marijuana     Allergies   Patient has no known allergies.   Review of Systems Review of Systems  Constitutional: Positive for fever.  HENT: Positive for sore throat.   Respiratory: Positive for cough.   Musculoskeletal: Positive for myalgias.  Neurological: Positive for headaches.  All other systems reviewed and are negative.    Physical Exam Triage Vital Signs ED Triage Vitals  Enc Vitals Group     BP 01/02/19 1942 126/63     Pulse --      Resp 01/02/19 1942 18     Temp 01/02/19 1942 100.3 F (37.9 C)     Temp Source 01/02/19 1942 Tympanic     SpO2 01/02/19 1942 100 %     Weight 01/02/19 1940 (!) 328 lb (148.8 kg)     Height --      Head Circumference --      Peak Flow --      Pain Score --      Pain Loc --      Pain Edu? --      Excl. in GC? --    No data found.  Updated Vital Signs BP 126/63 (BP Location: Right Arm)   Temp  100.3 F (37.9 C) (Tympanic)   Resp 18   Wt (!) 148.8 kg   LMP 12/31/2018   SpO2 100%   BMI 52.54 kg/m   Visual Acuity Right Eye Distance:   Left Eye Distance:   Bilateral Distance:    Right Eye Near:   Left Eye Near:    Bilateral Near:     Physical Exam Vitals signs and nursing note reviewed. Exam conducted with a chaperone present.  Constitutional:      Appearance: Normal appearance. She is obese.  HENT:     Head: Normocephalic.     Right Ear: Tympanic membrane normal.     Left Ear: Tympanic membrane normal.     Nose: Nose normal.     Mouth/Throat:     Mouth: Mucous membranes are moist.     Pharynx: Oropharynx is clear.  Neck:     Musculoskeletal: Normal range of motion and neck supple.  Cardiovascular:     Rate and Rhythm: Normal rate and regular rhythm.  Pulmonary:     Effort: Pulmonary effort is normal.     Breath sounds: Normal breath sounds.  Neurological:     General: No focal deficit present.     Mental Status: She is alert  and oriented to person, place, and time.      UC Treatments / Results  Labs (all labs ordered are listed, but only abnormal results are displayed) Labs Reviewed - No data to display  EKG None  Radiology No results found.  Procedures Procedures (including critical care time)  Medications Ordered in UC Medications - No data to display  Initial Impression / Assessment and Plan / UC Course  I have reviewed the triage vital signs and the nursing notes.  Pertinent labs & imaging results that were available during my care of the patient were reviewed by me and considered in my medical decision making (see chart for details).     Flulike illness Final Clinical Impressions(s) / UC Diagnoses   Final diagnoses:  None   Discharge Instructions   None    ED Prescriptions    None     Controlled Substance Prescriptions [place] Controlled Substance Registry consulted? No   Frederica Kuster, MD 01/02/19 2009

## 2019-01-02 NOTE — ED Triage Notes (Signed)
Pt cc she thinks she has the flu. Pt cc body aches, fever, cough and chills this started today.

## 2019-11-04 ENCOUNTER — Encounter: Payer: Self-pay | Admitting: Psychiatry

## 2019-11-04 ENCOUNTER — Ambulatory Visit (INDEPENDENT_AMBULATORY_CARE_PROVIDER_SITE_OTHER): Payer: BC Managed Care – PPO | Admitting: Psychiatry

## 2019-11-04 ENCOUNTER — Other Ambulatory Visit: Payer: Self-pay

## 2019-11-04 DIAGNOSIS — F410 Panic disorder [episodic paroxysmal anxiety] without agoraphobia: Secondary | ICD-10-CM | POA: Diagnosis not present

## 2019-11-04 DIAGNOSIS — F3131 Bipolar disorder, current episode depressed, mild: Secondary | ICD-10-CM | POA: Diagnosis not present

## 2019-11-04 DIAGNOSIS — F4310 Post-traumatic stress disorder, unspecified: Secondary | ICD-10-CM

## 2019-11-04 MED ORDER — HYDROXYZINE HCL 25 MG PO TABS: 25.0000 mg | ORAL_TABLET | Freq: Two times a day (BID) | ORAL | 1 refills | Status: DC | PRN

## 2019-11-04 MED ORDER — ARIPIPRAZOLE 10 MG PO TABS: 10.0000 mg | ORAL_TABLET | Freq: Every day | ORAL | 1 refills | Status: DC

## 2019-11-04 MED ORDER — VILAZODONE HCL 20 MG PO TABS: 20.0000 mg | ORAL_TABLET | Freq: Every day | ORAL | 1 refills | Status: DC

## 2019-11-04 NOTE — Progress Notes (Signed)
Psychiatric Initial Adult Assessment   I connected with  Dorothy Pierce on 11/04/19 by a video enabled telemedicine application and verified that I am speaking with the correct person using two identifiers.   I discussed the limitations of evaluation and management by telemedicine. The patient expressed understanding and agreed to proceed.   Patient Identification: Dorothy Pierce MRN:  [id number] Date of Evaluation:  11/04/2019   Referral Source: PCP  Chief Complaint:   Chief Complaint    Establish Care; Depression     Visit Diagnosis:    ICD-10-CM   1. Bipolar 1 disorder, depressed, mild (HCC)  F31.31 Vilazodone HCl 20 MG TABS    ARIPiprazole (ABILIFY) 10 MG tablet  2. Panic attacks  F41.0 Vilazodone HCl 20 MG TABS    hydrOXYzine (ATARAX/VISTARIL) 25 MG tablet  3. PTSD (post-traumatic stress disorder)  F43.10     History of Present Illness: This is a 33 year old female with history of bipolar disorder now seen for psychiatric evaluation.  Patient used to see a psychiatrist in the past but then stopped seeing them and then recently has been managed by her PCP.  Patient reported that she is still currently taking Abilify 10 mg daily and it does help her some.  She stated that it helps her be more productive at work.  She also feels that helps her energy level so that she can get out of bed.  However she still has days when she feels sad and tearful.  She reported having days when she cannot focus and does not have any appetite.  She reported frequent nightmares especially on the days when she is anxious.  She also reported having frequent episodes of anxiety and panic attacks.  She reported the episodes last for 10 to 15 minutes when she feels her heart races very fast and she has shortness of breath.  She informed that these episodes have been increasing in frequency lately and have been happening more than 5 times per week.  She reported she had an episode last night when she woke up  from sleep gasping for breath. She reported that she underwent some trauma in her teen years and during her college years she would go into classes drunk or high.  She will engage in sexual activities with strangers.  She had multiple sex partners at one time. She has had periods of time when she will buy things impulsively when she did not need them.  She has had periods of time when she will not need to sleep much and can function with only couple of hours of sleep. Lately she has been feeling very irritable with frequent mood swings.  She recently returned back to work and is doing fine for now.  She informed that she still has a very high sex drive and can be very pushy towards her partner. She reported having multiple episodes of suicidal ideations with passive suicidal attempts in the past.  For example she has purposely tried to walk into traffic when she was in college.  She has purposely taken sleeping pills and then driven on the road thinking that she would end her life.  Last time she had any suicidal thoughts was back in January of this year. She denied any recent suicidal ideations or attempts.  Past medications tried-she has taken Lexapro in the past but it made her feel like a zombie and lose her personality.  She also reported taking Zoloft and Prozac in the past and did not  like the way they made her feel.  She also took Lamictal and did not think it helped her much.  Patient reported that she has nightmares and flashbacks of trauma that occurred when she was a teenager but did not elaborate much on it.  She denied any psychotic symptoms like hallucinations or delusions.  Past Psychiatric History: Bipolar disorder, PTSD  Previous Psychotropic Medications: Yes  See HPI for details  Substance Abuse History in the last 12 months:  Yes.  occassional use of THC  Consequences of Substance Abuse: Negative  Past Medical History:  Past Medical History:  Diagnosis Date  . Bipolar 1  disorder ([place])   . HTN (hypertension)    History reviewed. No pertinent surgical history.  Family Psychiatric History: Mom- depression,anxiety  Family History:  Family History  Problem Relation Age of Onset  . Hypertension Mother   . Depression Mother   . Hyperlipidemia Mother   . Thyroid disease Mother   . Allergies Sister   . Depression Sister   . Asthma Sister   . Hypertension Maternal Aunt   . Hyperlipidemia Maternal Aunt   . Arthritis Maternal Aunt   . Cancer Maternal Aunt        skin/ breast  . Hypertension Maternal Uncle   . Hyperlipidemia Maternal Uncle   . Hypertension Maternal Grandmother   . Hyperlipidemia Maternal Grandmother   . Asthma Maternal Grandmother   . Diabetes Maternal Grandmother   . Hypertension Maternal Grandfather   . Hyperlipidemia Maternal Grandfather   . Diabetes Maternal Grandfather     Social History:   Social History   Socioeconomic History  . Marital status: Single    Spouse name: Not on file  . Number of children: Not on file  . Years of education: Not on file  . Highest education level: Not on file  Occupational History  . Not on file  Social Needs  . Financial resource strain: Not on file  . Food insecurity    Worry: Not on file    Inability: Not on file  . Transportation needs    Medical: Not on file    Non-medical: Not on file  Tobacco Use  . Smoking status: Former Research scientist (life sciences)  . Smokeless tobacco: Never Used  Substance and Sexual Activity  . Alcohol use: Yes    Alcohol/week: 0.0 standard drinks    Comment: rarely. history of addiction  . Drug use: Yes    Types: Marijuana    Comment: former marijuana  . Sexual activity: Yes    Partners: Female, Female    Comment: single right now  Lifestyle  . Physical activity    Days per week: Not on file    Minutes per session: Not on file  . Stress: Not on file  Relationships  . Social Herbalist on phone: Not on file    Gets together: Not on file    Attends  religious service: Not on file    Active member of club or organization: Not on file    Attends meetings of clubs or organizations: Not on file    Relationship status: Not on file  Other Topics Concern  . Not on file  Social History Narrative  . Not on file    Additional Social History: Works as a Restaurant manager, fast food  Allergies:  No Known Allergies  Metabolic Disorder Labs: No results found for: HGBA1C, MPG No results found for: PROLACTIN Lab Results  Component Value Date   CHOL 129  09/30/2015   TRIG 52 09/30/2015   HDL 43 (L) 09/30/2015   CHOLHDL 3.0 09/30/2015   VLDL 10 09/30/2015   LDLCALC 76 09/30/2015   Lab Results  Component Value Date   TSH 2.29 04/25/2017    Therapeutic Level Labs: No results found for: LITHIUM No results found for: CBMZ No results found for: VALPROATE  Current Medications: Current Outpatient Medications  Medication Sig Dispense Refill  . ARIPiprazole (ABILIFY) 10 MG tablet Take 1 tablet (10 mg total) by mouth daily. 30 tablet 1  . Cholecalciferol (VITAMIN D3) 1.25 MG (50000 UT) TABS Take by mouth.    . hydrOXYzine (ATARAX/VISTARIL) 25 MG tablet Take 1 tablet (25 mg total) by mouth 2 (two) times daily as needed for anxiety. 30 tablet 1  . Vilazodone HCl 20 MG TABS Take 1 tablet (20 mg total) by mouth daily. 30 tablet 1   No current facility-administered medications for this visit.     Musculoskeletal: Strength & Muscle Tone: unable to assess due to telemed visit Gait & Station: unable to assess due to telemed visit Patient leans: unable to assess due to telemed visit    Psychiatric Specialty Exam: ROS  There were no vitals taken for this visit.There is no height or weight on file to calculate BMI.  General Appearance: Fairly Groomed  Eye Contact:  Good  Speech:  Clear and Coherent and Normal Rate  Volume:  Normal  Mood:  Depressed  Affect:  Congruent  Thought Process:  Goal Directed, Linear and Descriptions of Associations:  Intact  Orientation:  Full (Time, Place, and Person)  Thought Content:  Logical  Suicidal Thoughts:  No  Homicidal Thoughts:  No  Memory:  Recent;   Good Remote;   Good  Judgement:  Fair  Insight:  Fair  Psychomotor Activity:  Normal  Concentration:  Concentration: Good and Attention Span: Fair  Recall:  Good  Fund of Knowledge:Good  Language: Good  Akathisia:  Negative  Handed:  Right  AIMS (if indicated):  not done  Assets:  Communication Skills Desire for Improvement Financial Resources/Insurance Social Support Talents/Skills Transportation  ADL's:  Intact  Cognition: WNL  Sleep:  Fair   Screenings: GAD-7     Office Visit from 04/25/2017 in [facility]  Total GAD-7 Score  18    PHQ2-9     Office Visit from 04/25/2017 in [facility] Office Visit from 09/17/2015 in [facility]  PHQ-2 Total Score  4  2  PHQ-9 Total Score  23  8      Assessment and Plan: 33 year old female with history of bipolar disorder anxiety now reporting ongoing depressive symptoms and panic attacks.  She is continue to take Abilify 10 mg which she finds helpful.  She was offered Viibryd for depression and anxiety symptoms.  She was also offered hydroxyzine 25 mg as needed for anxiety and panic attacks. Potential side effects of medication and risks vs benefits of treatment vs non-treatment were explained and discussed. All questions were answered.  1. Bipolar 1 disorder, depressed, mild (HCC)  - Start Vilazodone HCl 20 MG TABS; Take 1 tablet (20 mg total) by mouth daily.  Dispense: 30 tablet; Refill: 1 - ARIPiprazole (ABILIFY) 10 MG tablet; Take 1 tablet (10 mg total) by mouth daily.  Dispense: 30 tablet; Refill: 1  2. Panic attacks  - Vilazodone HCl 20 MG TABS; Take 1 tablet (20 mg total) by mouth daily.  Dispense: 30 tablet; Refill: 1 - hydrOXYzine (ATARAX/VISTARIL) 25 MG  tablet; Take 1 tablet (25 mg total) by mouth 2 (two) times daily as needed for  anxiety.  Dispense: 30 tablet; Refill: 1  3. PTSD (post-traumatic stress disorder) - Start Vilazodone HCl 20 MG TABS; Take 1 tablet (20 mg total) by mouth daily.  Dispense: 30 tablet; Refill: 1  Recommend to continue seeing therapist at [facility] in [place]. Follow-up in 6 weeks.  Zena Amos, MD 12/1/20202:18 PM

## 2019-12-17 ENCOUNTER — Emergency Department (HOSPITAL_COMMUNITY): Payer: BC Managed Care – PPO

## 2019-12-17 ENCOUNTER — Other Ambulatory Visit: Payer: Self-pay

## 2019-12-17 ENCOUNTER — Encounter (HOSPITAL_COMMUNITY): Payer: Self-pay | Admitting: Emergency Medicine

## 2019-12-17 ENCOUNTER — Emergency Department (HOSPITAL_COMMUNITY)
Admission: EM | Admit: 2019-12-17 | Discharge: 2019-12-17 | Disposition: A | Discharge status: Home / Self Care | Payer: BC Managed Care – PPO | Attending: Emergency Medicine | Admitting: Emergency Medicine

## 2019-12-17 DIAGNOSIS — I10 Essential (primary) hypertension: Secondary | ICD-10-CM | POA: Insufficient documentation

## 2019-12-17 DIAGNOSIS — R252 Cramp and spasm: Secondary | ICD-10-CM | POA: Insufficient documentation

## 2019-12-17 DIAGNOSIS — R079 Chest pain, unspecified: Secondary | ICD-10-CM | POA: Insufficient documentation

## 2019-12-17 DIAGNOSIS — R110 Nausea: Secondary | ICD-10-CM | POA: Insufficient documentation

## 2019-12-17 DIAGNOSIS — Z87891 Personal history of nicotine dependence: Secondary | ICD-10-CM | POA: Diagnosis not present

## 2019-12-17 DIAGNOSIS — Z79899 Other long term (current) drug therapy: Secondary | ICD-10-CM | POA: Insufficient documentation

## 2019-12-17 DIAGNOSIS — R0602 Shortness of breath: Secondary | ICD-10-CM | POA: Diagnosis not present

## 2019-12-17 HISTORY — DX: Obesity, unspecified: E66.9

## 2019-12-17 HISTORY — DX: Obesity, class 1: E66.811

## 2019-12-17 LAB — BASIC METABOLIC PANEL
Anion gap: 8 (ref 5–15)
BUN: 8 mg/dL (ref 6–20)
CO2: 23 mmol/L (ref 22–32)
Calcium: 9.3 mg/dL (ref 8.9–10.3)
Chloride: 106 mmol/L (ref 98–111)
Creatinine, Ser: 0.87 mg/dL (ref 0.44–1.00)
GFR calc Af Amer: >60 mL/min (ref >60)
GFR calc non Af Amer: >60 mL/min (ref >60)
Glucose, Bld: 85 mg/dL (ref 70–99)
Potassium: 4 mmol/L (ref 3.5–5.1)
Sodium: 137 mmol/L (ref 135–145)

## 2019-12-17 LAB — TROPONIN I (HIGH SENSITIVITY)
Troponin I (High Sensitivity): <2 ng/L (ref <18)
Troponin I (High Sensitivity): <2 ng/L (ref <18)

## 2019-12-17 LAB — CBC
HCT: 39.9 % (ref 36.0–46.0)
Hemoglobin: 12.8 g/dL (ref 12.0–15.0)
MCH: 31.5 pg (ref 26.0–34.0)
MCHC: 32.1 g/dL (ref 30.0–36.0)
MCV: 98.3 fL (ref 80.0–100.0)
Platelets: 338 10*3/uL (ref 150–400)
RBC: 4.06 MIL/uL (ref 3.87–5.11)
RDW: 12.2 % (ref 11.5–15.5)
WBC: 10.4 10*3/uL (ref 4.0–10.5)
nRBC: 0 % (ref 0.0–0.2)

## 2019-12-17 LAB — I-STAT BETA HCG BLOOD, ED (MC, WL, AP ONLY): I-stat hCG, quantitative: <5 m[IU]/mL (ref <5)

## 2019-12-17 MED ORDER — SODIUM CHLORIDE 0.9% FLUSH: 3.0000 mL | Freq: Once | INTRAVENOUS | Status: DC

## 2019-12-17 NOTE — ED Provider Notes (Signed)
[facility] EMERGENCY DEPARTMENT Provider Note   CSN: [id number] Arrival date & time: 12/17/19  1505     History Chief Complaint  Patient presents with  . Chest Pain    Dorothy Pierce is a 34 y.o. female with past medical history of hypertension, PTSD, panic attacks, GERD, bipolar 1 disorder, presenting to the emergency department with complaint of intermittent central sharp chest pain that began on Saturday.  She states symptoms initially began when she was driving and have been coming and going since.  Pain is sharp in the middle of her chest.  Sometimes she feels short of breath with it.  A little bit of nausea yesterday though no vomiting.  No associated diaphoresis, cough, fever, radiation of pain.  She reports some intermittent leg cramping bilaterally at night though no unilateral leg pain or swelling. she states she does have a known history of GERD the symptoms feel somewhat different.  No cardiac history.  No known history of hypertension, diabetes, hyperlipidemia, or CAD in first-degree relatives before the age of 66.  No history of DVT or PE, tobacco use, recent surgery or prolonged immobilization, exogenous estrogen use, or personal history of cancer.  The history is provided by the patient.       Past Medical History:  Diagnosis Date  . Bipolar 1 disorder (HCC)   . HTN (hypertension)   . Obesity (BMI 30.0-34.9)     Patient Active Problem List   Diagnosis Date Noted  . Panic attacks 11/04/2019  . PTSD (post-traumatic stress disorder) 11/04/2019  . Bipolar 1 disorder (HCC)   . Hypertension 12/15/2015  . GERD (gastroesophageal reflux disease) 12/15/2015  . Obesity, morbid, BMI 50 or higher (HCC) 09/30/2015    History reviewed. No pertinent surgical history.   OB History   No obstetric history on file.     Family History  Problem Relation Age of Onset  . Hypertension Mother   . Depression Mother   . Hyperlipidemia Mother   . Thyroid  disease Mother   . Allergies Sister   . Depression Sister   . Asthma Sister   . Hypertension Maternal Aunt   . Hyperlipidemia Maternal Aunt   . Arthritis Maternal Aunt   . Cancer Maternal Aunt        skin/ breast  . Hypertension Maternal Uncle   . Hyperlipidemia Maternal Uncle   . Hypertension Maternal Grandmother   . Hyperlipidemia Maternal Grandmother   . Asthma Maternal Grandmother   . Diabetes Maternal Grandmother   . Hypertension Maternal Grandfather   . Hyperlipidemia Maternal Grandfather   . Diabetes Maternal Grandfather     Social History   Tobacco Use  . Smoking status: Former Games developer  . Smokeless tobacco: Never Used  Substance Use Topics  . Alcohol use: Yes    Alcohol/week: 0.0 standard drinks    Comment: rarely. history of addiction  . Drug use: Yes    Types: Marijuana    Comment: former marijuana    Home Medications Prior to Admission medications   Medication Sig Start Date End Date Taking? Authorizing Provider  ARIPiprazole (ABILIFY) 10 MG tablet Take 1 tablet (10 mg total) by mouth daily. 11/04/19   Zena Amos, MD  hydrOXYzine (ATARAX/VISTARIL) 25 MG tablet Take 1 tablet (25 mg total) by mouth 2 (two) times daily as needed for anxiety. 11/04/19   Zena Amos, MD  Vilazodone HCl 20 MG TABS Take 1 tablet (20 mg total) by mouth daily. 11/04/19   Evelene Croon,  Mandeep, MD    Allergies    Patient has no known allergies.  Review of Systems   Review of Systems  All other systems reviewed and are negative.   Physical Exam Updated Vital Signs BP 114/61 (BP Location: Right Wrist)   Pulse 75   Temp (!) 97.5 F (36.4 C) (Oral)   Resp 16   LMP 11/23/2019   SpO2 99%   Physical Exam Vitals and nursing note reviewed.  Constitutional:      General: She is not in acute distress.    Appearance: She is well-developed. She is obese. She is not ill-appearing.  HENT:     Head: Normocephalic and atraumatic.  Eyes:     Conjunctiva/sclera: Conjunctivae normal.   Cardiovascular:     Rate and Rhythm: Normal rate and regular rhythm.  Pulmonary:     Effort: Pulmonary effort is normal. No respiratory distress.     Breath sounds: Normal breath sounds.  Abdominal:     General: Bowel sounds are normal.     Palpations: Abdomen is soft.     Tenderness: There is no abdominal tenderness.  Musculoskeletal:     Right lower leg: No edema.     Left lower leg: No edema.  Skin:    General: Skin is warm.  Neurological:     Mental Status: She is alert.  Psychiatric:        Behavior: Behavior normal.     ED Results / Procedures / Treatments   Labs (all labs ordered are listed, but only abnormal results are displayed) Labs Reviewed  BASIC METABOLIC PANEL  CBC  I-STAT BETA HCG BLOOD, ED ([facility], [facility], [facility] ONLY)  TROPONIN I (HIGH SENSITIVITY)  TROPONIN I (HIGH SENSITIVITY)    EKG EKG Interpretation  Date/Time:  Wednesday December 17 2019 15:15:33 EST Ventricular Rate:  82 PR Interval:  152 QRS Duration: 96 QT Interval:  388 QTC Calculation: 453 R Axis:   41 Text Interpretation: Normal sinus rhythm Normal ECG No STEMI Confirmed by Nanda Quinton [phone]) on 12/17/2019 7:12:17 PM   Radiology DG Chest 2 View  Result Date: 12/17/2019 CLINICAL DATA:  Chest pain since this weekend EXAM: CHEST - 2 VIEW COMPARISON:  Radiograph 05/08/2016 FINDINGS: No consolidation, features of edema, pneumothorax, or effusion. Pulmonary vascularity is normally distributed. The cardiomediastinal contours are unremarkable. No acute osseous or soft tissue abnormality. IMPRESSION: No acute cardiopulmonary abnormality. Electronically Signed   By: Lovena Le M.D.   On: 12/17/2019 18:03    Procedures Procedures (including critical care time)  Medications Ordered in ED Medications  sodium chloride flush (NS) 0.9 % injection 3 mL (3 mLs Intravenous Not Given 12/17/19 1912)    ED Course  I have reviewed the triage vital signs and the nursing notes.  Pertinent labs & imaging  results that were available during my care of the patient were reviewed by me and considered in my medical decision making (see chart for details).  Clinical Course as of Dec 16 2106  Wed Dec 17, 2019  1911 Low risk wells/PERC neg.   [JR]    Clinical Course User Index [JR] Lillian Tigges, [place] N, PA-C   MDM Rules/Calculators/A&P                      Patient presenting with multiple days of intermittent central sharp chest pains with some associated shortness of breath. Patient has a low heart score as well as low risk Wells/PERC negative. On exam she is currently asymptomatic.  Vital signs are stable, afebrile. EKG without arrhythmia or evidence of ischemia. Chest x-ray is negative. Labs with normal troponins x2, otherwise unremarkable. Low suspicion for ACS or pulmonary etiology of symptoms. She does have history of GERD, encourage she treat symptoms as needed. Also consider musculoskeletal pain. Recommend over-the-counter medications such as Tylenol. Encouraged close PCP follow-up and strict precautions. Patient is agreeable plan and safe for discharge.  Discussed results, findings, treatment and follow up. Patient advised of return precautions. Patient verbalized understanding and agreed with plan.  Final Clinical Impression(s) / ED Diagnoses Final diagnoses:  Intermittent chest pain    Rx / DC Orders ED Discharge Orders    None       Lavante Toso, [place] N, PA-C 12/17/19 2109    Maia Plan, MD 12/18/19 1252

## 2019-12-17 NOTE — ED Triage Notes (Signed)
Cp on and off since the weekend , some sob  And cramps in her legs and swelling in hand and ankles  X a couple of years , some nausea yesterday no vomiting

## 2019-12-17 NOTE — Discharge Instructions (Addendum)
Your labs, EKG and chest xray are very reassuring today. Please follow up closely with your primary care provider regarding your visit. Return to the ER if you develop more persistent shortness of breath, severely worsening chest pain, or new or concerning symptoms.

## 2019-12-19 ENCOUNTER — Encounter: Payer: Self-pay | Admitting: Psychiatry

## 2019-12-19 ENCOUNTER — Ambulatory Visit (INDEPENDENT_AMBULATORY_CARE_PROVIDER_SITE_OTHER): Payer: BC Managed Care – PPO | Admitting: Psychiatry

## 2019-12-19 ENCOUNTER — Other Ambulatory Visit: Payer: Self-pay

## 2019-12-19 DIAGNOSIS — F4310 Post-traumatic stress disorder, unspecified: Secondary | ICD-10-CM

## 2019-12-19 DIAGNOSIS — F3131 Bipolar disorder, current episode depressed, mild: Secondary | ICD-10-CM | POA: Diagnosis not present

## 2019-12-19 DIAGNOSIS — F410 Panic disorder [episodic paroxysmal anxiety] without agoraphobia: Secondary | ICD-10-CM | POA: Diagnosis not present

## 2019-12-19 MED ORDER — HYDROXYZINE HCL 25 MG PO TABS: 25.0000 mg | ORAL_TABLET | Freq: Two times a day (BID) | ORAL | 1 refills | Status: DC | PRN

## 2019-12-19 MED ORDER — BUPROPION HCL ER (XL) 150 MG PO TB24: 150.0000 mg | ORAL_TABLET | ORAL | 1 refills | Status: DC

## 2019-12-19 MED ORDER — ARIPIPRAZOLE 10 MG PO TABS: 10.0000 mg | ORAL_TABLET | Freq: Every day | ORAL | 1 refills | Status: DC

## 2019-12-19 NOTE — Progress Notes (Signed)
[facility] MD OP Progress Note  I connected with  Dorothy Pierce on 12/19/19 by a video enabled telemedicine application and verified that I am speaking with the correct person using two identifiers.   I discussed the limitations of evaluation and management by telemedicine. The patient expressed understanding and agreed to proceed.    12/19/2019 11:07 AM Dorothy Pierce  MRN:  [id number]  Chief Complaint: " I am doing okay."  HPI: Patient reported that she could not fill her prescription for Viibryd as her insurance would not cover it and she had $300 plus co-pay.  She has continued to take the Abilify regularly.  She informed that if she misses a day or 2 of Abilify she notices that her motivation levels drop so she tries to take the medicine regularly.  She reported that she has not had any depressive episodes lately.  She informed that hydroxyzine has been really helpful with her anxiety and her panic attacks have been under control. She denied any acute issues or concerns.  She was agreeable to trying a different antidepressant Wellbutrin.  She has tried Lexapro, Zoloft, Prozac in the past and did not like the way they made her feel. She still seeing the same therapist at [facility].  Visit Diagnosis:    ICD-10-CM   1. Bipolar 1 disorder, depressed, mild ([place])  F31.31   2. Panic attacks  F41.0   3. PTSD (post-traumatic stress disorder)  F43.10     Past Psychiatric History: bipolar d/o, panic attacks, PTSD  Past Medical History:  Past Medical History:  Diagnosis Date  . Bipolar 1 disorder ([place])   . HTN (hypertension)   . Obesity (BMI 30.0-34.9)    No past surgical history on file.  Family Psychiatric History: see below  Family History:  Family History  Problem Relation Age of Onset  . Hypertension Mother   . Depression Mother   . Hyperlipidemia Mother   . Thyroid disease Mother   . Allergies Sister   . Depression Sister   . Asthma Sister   . Hypertension Maternal Aunt   .  Hyperlipidemia Maternal Aunt   . Arthritis Maternal Aunt   . Cancer Maternal Aunt        skin/ breast  . Hypertension Maternal Uncle   . Hyperlipidemia Maternal Uncle   . Hypertension Maternal Grandmother   . Hyperlipidemia Maternal Grandmother   . Asthma Maternal Grandmother   . Diabetes Maternal Grandmother   . Hypertension Maternal Grandfather   . Hyperlipidemia Maternal Grandfather   . Diabetes Maternal Grandfather     Social History:  Social History   Socioeconomic History  . Marital status: Single    Spouse name: Not on file  . Number of children: Not on file  . Years of education: Not on file  . Highest education level: Not on file  Occupational History  . Not on file  Tobacco Use  . Smoking status: Former Research scientist (life sciences)  . Smokeless tobacco: Never Used  Substance and Sexual Activity  . Alcohol use: Yes    Alcohol/week: 0.0 standard drinks    Comment: rarely. history of addiction  . Drug use: Yes    Types: Marijuana    Comment: former marijuana  . Sexual activity: Yes    Partners: Female, Female    Comment: single right now  Other Topics Concern  . Not on file  Social History Narrative  . Not on file   Social Determinants of Health   Financial Resource Strain:   .  Difficulty of Paying Living Expenses: Not on file  Food Insecurity:   . Worried About Programme researcher, broadcasting/film/video in the Last Year: Not on file  . Ran Out of Food in the Last Year: Not on file  Transportation Needs:   . Lack of Transportation (Medical): Not on file  . Lack of Transportation (Non-Medical): Not on file  Physical Activity:   . Days of Exercise per Week: Not on file  . Minutes of Exercise per Session: Not on file  Stress:   . Feeling of Stress : Not on file  Social Connections:   . Frequency of Communication with Friends and Family: Not on file  . Frequency of Social Gatherings with Friends and Family: Not on file  . Attends Religious Services: Not on file  . Active Member of Clubs or  Organizations: Not on file  . Attends Banker Meetings: Not on file  . Marital Status: Not on file    Allergies: No Known Allergies  Metabolic Disorder Labs: No results found for: HGBA1C, MPG No results found for: PROLACTIN Lab Results  Component Value Date   CHOL 129 09/30/2015   TRIG 52 09/30/2015   HDL 43 (L) 09/30/2015   CHOLHDL 3.0 09/30/2015   VLDL 10 09/30/2015   LDLCALC 76 09/30/2015   Lab Results  Component Value Date   TSH 2.29 04/25/2017   TSH 1.652 09/30/2015    Therapeutic Level Labs: No results found for: LITHIUM No results found for: VALPROATE No components found for:  CBMZ  Current Medications: Current Outpatient Medications  Medication Sig Dispense Refill  . ARIPiprazole (ABILIFY) 10 MG tablet Take 1 tablet (10 mg total) by mouth daily. 30 tablet 1  . hydrOXYzine (ATARAX/VISTARIL) 25 MG tablet Take 1 tablet (25 mg total) by mouth 2 (two) times daily as needed for anxiety. 30 tablet 1  . Vilazodone HCl 20 MG TABS Take 1 tablet (20 mg total) by mouth daily. 30 tablet 1   No current facility-administered medications for this visit.     Musculoskeletal: Strength & Muscle Tone: unable to assess due to telemed visit Gait & Station: unable to assess due to telemed visit Patient leans: unable to assess due to telemed visit  Psychiatric Specialty Exam: Review of Systems  Last menstrual period 11/23/2019.There is no height or weight on file to calculate BMI.  General Appearance: Fairly Groomed  Eye Contact:  Good  Speech:  Clear and Coherent and Normal Rate  Volume:  Normal  Mood:  Euthymic  Affect:  Congruent  Thought Process:  Goal Directed, Linear and Descriptions of Associations: Intact  Orientation:  Full (Time, Place, and Person)  Thought Content: Logical   Suicidal Thoughts:  No  Homicidal Thoughts:  No  Memory:  Recent;   Good Remote;   Good  Judgement:  Fair  Insight:  Fair  Psychomotor Activity:  Normal  Concentration:   Concentration: Good and Attention Span: Good  Recall:  Good  Fund of Knowledge: Good  Language: Good  Akathisia:  Negative  Handed:  Right  AIMS (if indicated): not done due to telemed visit  Assets:  Communication Skills Desire for Improvement Financial Resources/Insurance Housing  ADL's:  Intact  Cognition: WNL  Sleep:  Good   Screenings: GAD-7     Office Visit from 04/25/2017 in [facility]  Total GAD-7 Score  18    PHQ2-9     Office Visit from 04/25/2017 in [facility] Office Visit from 09/17/2015 in [facility]  PHQ-2 Total Score  4  2  PHQ-9 Total Score  23  8       Assessment and Plan: Patient reported improvement in anxiety and panic attacks with hydroxyzine however still has some mild depressive symptoms.  She continue start Viibryd due to insurance coverage issues.  She is agreeable to trial of Wellbutrin. Potential side effects of medication and risks vs benefits of treatment vs non-treatment were explained and discussed. All questions were answered.  1. Bipolar 1 disorder, depressed, mild (HCC)  - ARIPiprazole (ABILIFY) 10 MG tablet; Take 1 tablet (10 mg total) by mouth daily.  Dispense: 30 tablet; Refill: 1 - Start buPROPion (WELLBUTRIN XL) 150 MG 24 hr tablet; Take 1 tablet (150 mg total) by mouth every morning.  Dispense: 30 tablet; Refill: 1  2. Panic attacks  - hydrOXYzine (ATARAX/VISTARIL) 25 MG tablet; Take 1 tablet (25 mg total) by mouth 2 (two) times daily as needed for anxiety.  Dispense: 30 tablet; Refill: 1  3. PTSD (post-traumatic stress disorder)  - buPROPion (WELLBUTRIN XL) 150 MG 24 hr tablet; Take 1 tablet (150 mg total) by mouth every morning.  Dispense: 30 tablet; Refill: 1  Continue individual therapy.   Follow-up in 6 weeks.  Zena Amos, MD 12/19/2019, 11:07 AM

## 2019-12-29 ENCOUNTER — Telehealth (HOSPITAL_COMMUNITY): Payer: Self-pay | Admitting: *Deleted

## 2019-12-29 MED ORDER — VENLAFAXINE HCL ER 37.5 MG PO CP24: ORAL_CAPSULE | ORAL | 0 refills | Status: DC

## 2019-12-29 NOTE — Telephone Encounter (Signed)
Writer returned pt call regarding c/o SE from the recently started Wellbutrin. Pt states that she feels "more irritable" and depressed since starting said medication. Pt denies s.i. currently but does not want to continue medication. Please review and advise.

## 2019-12-29 NOTE — Telephone Encounter (Signed)
I called and spoke with the patient.  Patient reported that after she started taking Wellbutrin she noticed increase in irritability and also reported feeling more depressed.  She informed that 2 days ago she had to undergo work-up because of chest discomfort which the other providers believes is due to stress. As per EMR she has tried Lexapro, Zoloft, Prozac in the past but did not like the way they made her feel.  She was recommended to discontinue Wellbutrin.  She was offered venlafaxine ER to help her symptoms of depression and anxiety. Potential side effects of medication and risks vs benefits of treatment vs non-treatment were explained and discussed. All questions were answered. Start Venlafaxine Er 37.5 mg for 1 week then take 2 capsules till next visit on 01/27/2020.

## 2020-01-26 ENCOUNTER — Telehealth: Payer: Self-pay

## 2020-01-26 NOTE — Telephone Encounter (Signed)
Received fax for venlafaxine

## 2020-01-26 NOTE — Telephone Encounter (Signed)
Not my patient

## 2020-01-26 NOTE — Telephone Encounter (Signed)
I sent to dr. Vanetta Shawl by accident, this is your patient. sorry

## 2020-01-27 ENCOUNTER — Ambulatory Visit: Payer: BC Managed Care – PPO | Admitting: Psychiatry

## 2020-01-27 MED ORDER — VENLAFAXINE HCL ER 37.5 MG PO CP24: ORAL_CAPSULE | ORAL | 0 refills | Status: DC

## 2020-01-27 NOTE — Telephone Encounter (Signed)
Prescription sent

## 2020-01-29 ENCOUNTER — Ambulatory Visit (INDEPENDENT_AMBULATORY_CARE_PROVIDER_SITE_OTHER): Payer: BC Managed Care – PPO | Admitting: Psychiatry

## 2020-01-29 ENCOUNTER — Other Ambulatory Visit: Payer: Self-pay

## 2020-01-29 ENCOUNTER — Encounter: Payer: Self-pay | Admitting: Psychiatry

## 2020-01-29 DIAGNOSIS — F4310 Post-traumatic stress disorder, unspecified: Secondary | ICD-10-CM | POA: Diagnosis not present

## 2020-01-29 DIAGNOSIS — F410 Panic disorder [episodic paroxysmal anxiety] without agoraphobia: Secondary | ICD-10-CM | POA: Diagnosis not present

## 2020-01-29 DIAGNOSIS — F3131 Bipolar disorder, current episode depressed, mild: Secondary | ICD-10-CM

## 2020-01-29 MED ORDER — ARIPIPRAZOLE 10 MG PO TABS: 10.0000 mg | ORAL_TABLET | Freq: Every day | ORAL | 1 refills | Status: DC

## 2020-01-29 MED ORDER — VILAZODONE HCL 20 MG PO TABS: 20.0000 mg | ORAL_TABLET | Freq: Every day | ORAL | 1 refills | Status: DC

## 2020-01-29 MED ORDER — HYDROXYZINE HCL 25 MG PO TABS: 25.0000 mg | ORAL_TABLET | Freq: Two times a day (BID) | ORAL | 1 refills | Status: DC | PRN

## 2020-01-29 NOTE — Progress Notes (Signed)
[facility] MD OP Progress Note  I connected with  Dorothy Pierce on 01/29/20 by a video enabled telemedicine application and verified that I am speaking with the correct person using two identifiers.   I discussed the limitations of evaluation and management by telemedicine. The patient expressed understanding and agreed to proceed.    01/29/2020 2:21 PM Dorothy Pierce  MRN:  [id number]  Chief Complaint: " I am still feeling sort of irritable."  HPI: Patient was prescribed Wellbutrin at the time of her last appointment to target depressive symptoms.  However after trying it for few days she called the clinic and reported that she had noticed she was increasingly irritable with Wellbutrin and did not want to continue taking it.  She was then prescribed Effexor which she has been taking.  She is currently taking 2 tablets of 37.5 mg strength which is equivalent to 75 mg.  She stated that although Effexor has helped her mood some she still feels irritable and also has noticed that she has developed anorgasmia.   Past meds: She has tried Lexapro, Zoloft, Prozac in the past and did not like the way they made her feel, Wellbutrin- more irritable, Effexor- mild irritability and anorgasmia.  She still seeing the same therapist at [facility].  I had prescribed Viibryd to her in the past however due to insurance coverage she could not fill the prescription.  Patient was asked if she would like to give that a try.  She was offered samples that can be picked up from the clinic.  She was advised to try it due to its lower propensity to cause sexual side effects and irritability. Pt agreed to try it.   Visit Diagnosis:    ICD-10-CM   1. Bipolar 1 disorder, depressed, mild (H[facility])  F31.31   2. Panic attacks  F41.0   3. PTSD (post-traumatic stress disorder)  F43.10     Past Psychiatric History: bipolar d/o, panic attacks, PTSD  Past Medical History:  Past Medical History:  Diagnosis Date  . Bipolar 1  disorder ([facility])   . HTN (hypertension)   . Obesity (BMI 30.0-34.9)    History reviewed. No pertinent surgical history.  Family Psychiatric History: see below  Family History:  Family History  Problem Relation Age of Onset  . Hypertension Mother   . Depression Mother   . Hyperlipidemia Mother   . Thyroid disease Mother   . Allergies Sister   . Depression Sister   . Asthma Sister   . Hypertension Maternal Aunt   . Hyperlipidemia Maternal Aunt   . Arthritis Maternal Aunt   . Cancer Maternal Aunt        skin/ breast  . Hypertension Maternal Uncle   . Hyperlipidemia Maternal Uncle   . Hypertension Maternal Grandmother   . Hyperlipidemia Maternal Grandmother   . Asthma Maternal Grandmother   . Diabetes Maternal Grandmother   . Hypertension Maternal Grandfather   . Hyperlipidemia Maternal Grandfather   . Diabetes Maternal Grandfather     Social History:  Social History   Socioeconomic History  . Marital status: Single    Spouse name: Not on file  . Number of children: Not on file  . Years of education: Not on file  . Highest education level: Not on file  Occupational History  . Not on file  Tobacco Use  . Smoking status: Former Games developer  . Smokeless tobacco: Never Used  Substance and Sexual Activity  . Alcohol use: Yes  Alcohol/week: 0.0 standard drinks    Comment: rarely. history of addiction  . Drug use: Yes    Types: Marijuana    Comment: former marijuana  . Sexual activity: Yes    Partners: Female, Female    Comment: single right now  Other Topics Concern  . Not on file  Social History Narrative  . Not on file   Social Determinants of Health   Financial Resource Strain:   . Difficulty of Paying Living Expenses: Not on file  Food Insecurity:   . Worried About Programme researcher, broadcasting/film/video in the Last Year: Not on file  . Ran Out of Food in the Last Year: Not on file  Transportation Needs:   . Lack of Transportation (Medical): Not on file  . Lack of Transportation  (Non-Medical): Not on file  Physical Activity:   . Days of Exercise per Week: Not on file  . Minutes of Exercise per Session: Not on file  Stress:   . Feeling of Stress : Not on file  Social Connections:   . Frequency of Communication with Friends and Family: Not on file  . Frequency of Social Gatherings with Friends and Family: Not on file  . Attends Religious Services: Not on file  . Active Member of Clubs or Organizations: Not on file  . Attends Banker Meetings: Not on file  . Marital Status: Not on file    Allergies: No Known Allergies  Metabolic Disorder Labs: No results found for: HGBA1C, MPG No results found for: PROLACTIN Lab Results  Component Value Date   CHOL 129 09/30/2015   TRIG 52 09/30/2015   HDL 43 (L) 09/30/2015   CHOLHDL 3.0 09/30/2015   VLDL 10 09/30/2015   LDLCALC 76 09/30/2015   Lab Results  Component Value Date   TSH 2.29 04/25/2017   TSH 1.652 09/30/2015    Therapeutic Level Labs: No results found for: LITHIUM No results found for: VALPROATE No components found for:  CBMZ  Current Medications: Current Outpatient Medications  Medication Sig Dispense Refill  . ARIPiprazole (ABILIFY) 10 MG tablet Take 1 tablet (10 mg total) by mouth daily. 30 tablet 1  . hydrOXYzine (ATARAX/VISTARIL) 25 MG tablet Take 1 tablet (25 mg total) by mouth 2 (two) times daily as needed for anxiety. 30 tablet 1  . venlafaxine XR (EFFEXOR XR) 37.5 MG 24 hr capsule Take 2 capsules in the morning 60 capsule 0   No current facility-administered medications for this visit.     Musculoskeletal: Strength & Muscle Tone: unable to assess due to telemed visit Gait & Station: unable to assess due to telemed visit Patient leans: unable to assess due to telemed visit  Psychiatric Specialty Exam: Review of Systems  There were no vitals taken for this visit.There is no height or weight on file to calculate BMI.  General Appearance: Fairly Groomed  Eye Contact:   Good  Speech:  Clear and Coherent and Normal Rate  Volume:  Normal  Mood:  Slightly depressed  Affect:  Congruent  Thought Process:  Goal Directed, Linear and Descriptions of Associations: Intact  Orientation:  Full (Time, Place, and Person)  Thought Content: Logical   Suicidal Thoughts:  No  Homicidal Thoughts:  No  Memory:  Recent;   Good Remote;   Good  Judgement:  Fair  Insight:  Fair  Psychomotor Activity:  Normal  Concentration:  Concentration: Good and Attention Span: Good  Recall:  Good  Fund of Knowledge: Good  Language: Good  Akathisia:  Negative  Handed:  Right  AIMS (if indicated): not done due to telemed visit  Assets:  Communication Skills Desire for Improvement Financial Resources/Insurance Housing  ADL's:  Intact  Cognition: WNL  Sleep:  Fair   Screenings: GAD-7     Office Visit from 04/25/2017 in [place]  Total GAD-7 Score  18    PHQ2-9     Office Visit from 04/25/2017 in [place] Visit from 09/17/2015 in [place]  PHQ-2 Total Score  4  2  PHQ-9 Total Score  23  8       Assessment and Plan: Patient is reporting mild irritability and incomplete remission of symptoms with Effexor.  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  She could not fill the prescription for Vilazodone due to insurance issues in the past.  However she is agreeable to trying samples and office staff will try to get prior authorization for it.   1. Panic attacks  - hydrOXYzine (ATARAX/VISTARIL) 25 MG tablet; Take 1 tablet (25 mg total) by mouth 2 (two) times daily as needed for anxiety.  Dispense: 30 tablet; Refill: 1  2. Bipolar 1 disorder, depressed, mild (HCC)  - ARIPiprazole (ABILIFY) 10 MG tablet; Take 1 tablet (10 mg total) by mouth daily.  Dispense: 30 tablet; Refill: 1 - Vilazodone HCl 20 MG TABS; Take 1 tablet (20 mg total) by mouth daily.  Dispense: 30 tablet; Refill: 1 - Taper off Effexor XR to 37.5 mg for 1  week then discontinue.  3. PTSD (post-traumatic stress disorder)  - Vilazodone HCl 20 MG TABS; Take 1 tablet (20 mg total) by mouth daily.  Dispense: 30 tablet; Refill: 1  Continue individual therapy.   Follow-up in 4 weeks.  [place] Crane, MD 01/29/2020, 2:21 PM

## 2020-02-02 ENCOUNTER — Telehealth: Payer: Self-pay

## 2020-02-02 NOTE — Telephone Encounter (Signed)
Medication management - Prior authorzation for Viibryd submitted online with CoverMyMeds and decision pending.

## 2020-02-04 ENCOUNTER — Telehealth: Payer: Self-pay

## 2020-02-04 NOTE — Telephone Encounter (Signed)
[facility] [employer] COVERAGE APPROVED  VILAZODONE (VIIBRYD) 20MG  TABLET EFFECTIVE 02/02/2020 TO 02/01/2021

## 2020-02-09 ENCOUNTER — Telehealth (HOSPITAL_COMMUNITY): Payer: Self-pay | Admitting: *Deleted

## 2020-02-09 NOTE — Telephone Encounter (Signed)
Writer received t/c from pt c/o decreased sleep, nausea and headache nightly, as well as decreased concentration since medication changes. Pt has upcoming appointment on 02/26/20.  Please review and advise.

## 2020-02-10 MED ORDER — TRAZODONE HCL 50 MG PO TABS: 50.0000 mg | ORAL_TABLET | Freq: Every day | ORAL | 0 refills | Status: DC

## 2020-02-10 NOTE — Telephone Encounter (Signed)
I called and spoke with the patient.  Patient informed that she is still having trouble with sleeping.  She stated that her mind races and she cannot fall asleep at night.  And not sleeping well causes her to have headaches. She stated that she has been taking Viibryd for more than a week now and she has not noticed any significant change in her mood so far.  Patient was informed that Viibryd coverage has been approved by her insurance after appeal was done by the office.  Patient was advised to pick up the prescription and keep taking the Viibryd as it may be too early to tell if the medicine was effective or not.  She was recommended to try a sleeping medicine namely trazodone at bedtime to help her with sleep. Potential side effects of medication and risks vs benefits of treatment vs non-treatment were explained and discussed. All questions were answered.

## 2020-02-26 ENCOUNTER — Ambulatory Visit (INDEPENDENT_AMBULATORY_CARE_PROVIDER_SITE_OTHER): Payer: BC Managed Care – PPO | Admitting: Psychiatry

## 2020-02-26 ENCOUNTER — Other Ambulatory Visit: Payer: Self-pay

## 2020-02-26 ENCOUNTER — Encounter: Payer: Self-pay | Admitting: Psychiatry

## 2020-02-26 DIAGNOSIS — F410 Panic disorder [episodic paroxysmal anxiety] without agoraphobia: Secondary | ICD-10-CM

## 2020-02-26 DIAGNOSIS — F4310 Post-traumatic stress disorder, unspecified: Secondary | ICD-10-CM | POA: Diagnosis not present

## 2020-02-26 DIAGNOSIS — F3131 Bipolar disorder, current episode depressed, mild: Secondary | ICD-10-CM

## 2020-02-26 MED ORDER — VILAZODONE HCL 20 MG PO TABS: 20.0000 mg | ORAL_TABLET | Freq: Every day | ORAL | 1 refills | Status: DC

## 2020-02-26 MED ORDER — TEMAZEPAM 15 MG PO CAPS: 15.0000 mg | ORAL_CAPSULE | Freq: Every day | ORAL | 1 refills | Status: DC

## 2020-02-26 MED ORDER — ARIPIPRAZOLE 10 MG PO TABS: 10.0000 mg | ORAL_TABLET | Freq: Every day | ORAL | 1 refills | Status: DC

## 2020-02-26 NOTE — Progress Notes (Signed)
[facility] MD OP Progress Note  I connected with  Dorothy Pierce on 02/26/20 by a video enabled telemedicine application and verified that I am speaking with the correct person using two identifiers.   I discussed the limitations of evaluation and management by telemedicine. The patient expressed understanding and agreed to proceed.    02/26/2020 1:43 PM Dorothy Pierce  MRN:  [id number]  Chief Complaint: " I am still not sleeping."  HPI: Patient reported that she tried Trazodone and did not find it to be helpful for sleep. She took it last night and woke up middle of the night and could not return back to sleep till around 3 am. She also reported that her mood is not as stable as she would like it to be with Viibryd. Pt stated that she wants to focus on her sleep as it is impacting her functioning during the day time. She also informed that she has been taking hydroxyzine as needed but does not find it to be as helpful as it used to be.  Pt was offered pharmacogenetics testing due to failure of several different medications in the recent past.  In the interim patient was offered temazepam to help with insomnia. Patient was agreeable to undergoing pharmacokinetic testing and also trial of temazepam for sleep.  Visit Diagnosis:    ICD-10-CM   1. Bipolar 1 disorder, depressed, mild ([place])  F31.31   2. PTSD (post-traumatic stress disorder)  F43.10   3. Panic attacks  F41.0     Past Psychiatric History: bipolar d/o, panic attacks, PTSD  Past Medical History:  Past Medical History:  Diagnosis Date  . Bipolar 1 disorder ([place])   . HTN (hypertension)   . Obesity (BMI 30.0-34.9)    No past surgical history on file.  Family Psychiatric History: see below  Family History:  Family History  Problem Relation Age of Onset  . Hypertension Mother   . Depression Mother   . Hyperlipidemia Mother   . Thyroid disease Mother   . Allergies Sister   . Depression Sister   . Asthma Sister   .  Hypertension Maternal Aunt   . Hyperlipidemia Maternal Aunt   . Arthritis Maternal Aunt   . Cancer Maternal Aunt        skin/ breast  . Hypertension Maternal Uncle   . Hyperlipidemia Maternal Uncle   . Hypertension Maternal Grandmother   . Hyperlipidemia Maternal Grandmother   . Asthma Maternal Grandmother   . Diabetes Maternal Grandmother   . Hypertension Maternal Grandfather   . Hyperlipidemia Maternal Grandfather   . Diabetes Maternal Grandfather     Social History:  Social History   Socioeconomic History  . Marital status: Single    Spouse name: Not on file  . Number of children: Not on file  . Years of education: Not on file  . Highest education level: Not on file  Occupational History  . Not on file  Tobacco Use  . Smoking status: Former Research scientist (life sciences)  . Smokeless tobacco: Never Used  Substance and Sexual Activity  . Alcohol use: Yes    Alcohol/week: 0.0 standard drinks    Comment: rarely. history of addiction  . Drug use: Yes    Types: Marijuana    Comment: former marijuana  . Sexual activity: Yes    Partners: Female, Female    Comment: single right now  Other Topics Concern  . Not on file  Social History Narrative  . Not on file   Social  Determinants of Health   Financial Resource Strain:   . Difficulty of Paying Living Expenses:   Food Insecurity:   . Worried About Programme researcher, broadcasting/film/video in the Last Year:   . Barista in the Last Year:   Transportation Needs:   . Freight forwarder (Medical):   Marland Kitchen Lack of Transportation (Non-Medical):   Physical Activity:   . Days of Exercise per Week:   . Minutes of Exercise per Session:   Stress:   . Feeling of Stress :   Social Connections:   . Frequency of Communication with Friends and Family:   . Frequency of Social Gatherings with Friends and Family:   . Attends Religious Services:   . Active Member of Clubs or Organizations:   . Attends Banker Meetings:   Marland Kitchen Marital Status:      Allergies: No Known Allergies  Metabolic Disorder Labs: No results found for: HGBA1C, MPG No results found for: PROLACTIN Lab Results  Component Value Date   CHOL 129 09/30/2015   TRIG 52 09/30/2015   HDL 43 (L) 09/30/2015   CHOLHDL 3.0 09/30/2015   VLDL 10 09/30/2015   LDLCALC 76 09/30/2015   Lab Results  Component Value Date   TSH 2.29 04/25/2017   TSH 1.652 09/30/2015    Therapeutic Level Labs: No results found for: LITHIUM No results found for: VALPROATE No components found for:  CBMZ  Current Medications: Current Outpatient Medications  Medication Sig Dispense Refill  . ARIPiprazole (ABILIFY) 10 MG tablet Take 1 tablet (10 mg total) by mouth daily. 30 tablet 1  . hydrOXYzine (ATARAX/VISTARIL) 25 MG tablet Take 1 tablet (25 mg total) by mouth 2 (two) times daily as needed for anxiety. 30 tablet 1  . traZODone (DESYREL) 50 MG tablet Take 1 tablet (50 mg total) by mouth at bedtime. 30 tablet 0  . Vilazodone HCl 20 MG TABS Take 1 tablet (20 mg total) by mouth daily. 30 tablet 1   No current facility-administered medications for this visit.     Musculoskeletal: Strength & Muscle Tone: unable to assess due to telemed visit Gait & Station: unable to assess due to telemed visit Patient leans: unable to assess due to telemed visit  Psychiatric Specialty Exam: Review of Systems  There were no vitals taken for this visit.There is no height or weight on file to calculate BMI.  General Appearance: Well Groomed  Eye Contact:  Good  Speech:  Clear and Coherent and Normal Rate  Volume:  Normal  Mood:  Slightly depressed  Affect:  Congruent  Thought Process:  Goal Directed, Linear and Descriptions of Associations: Intact  Orientation:  Full (Time, Place, and Person)  Thought Content: Logical   Suicidal Thoughts:  No  Homicidal Thoughts:  No  Memory:  Recent;   Good Remote;   Good  Judgement:  Fair  Insight:  Fair  Psychomotor Activity:  Normal  Concentration:   Concentration: Good and Attention Span: Good  Recall:  Good  Fund of Knowledge: Good  Language: Good  Akathisia:  Negative  Handed:  Right  AIMS (if indicated): not done due to telemed visit  Assets:  Communication Skills Desire for Improvement Financial Resources/Insurance Housing  ADL's:  Intact  Cognition: WNL  Sleep:  Poor   Screenings: GAD-7     Office Visit from 04/25/2017 in [facility]  Total GAD-7 Score  18    PHQ2-9     Office Visit from 04/25/2017 in [facility] Office Visit from 09/17/2015 in [facility]  PHQ-2 Total Score  4  2  PHQ-9 Total Score  23  8       Assessment and Plan: Patient continues to report irritability and some mood swings and mild depressive symptoms.  She has not found Viibryd to be particularly helpful but does not want to try higher dose at this point.  She also is struggling with poor sleep and would like to try different sleeping medicine.  She was offered temazepam to target insomnia. Potential side effects of medication and risks vs benefits of treatment vs non-treatment were explained and discussed. All questions were answered. She was recommended pharmacogenetics testing due to several medication failures in the recent past.  Patient was agreeable to that.  Patient was advised to come to the clinic next week for buccal mucosa sample collection in the office setting.  1. Bipolar 1 disorder, depressed, mild (HCC)  - ARIPiprazole (ABILIFY) 10 MG tablet; Take 1 tablet (10 mg total) by mouth daily.  Dispense: 30 tablet; Refill: 1 - Continue Vilazodone HCl 20 MG TABS; Take 1 tablet (20 mg total) by mouth daily.  Dispense: 30 tablet; Refill: 1 - Start temazepam (RESTORIL) 15 MG capsule; Take 1 capsule (15 mg total) by mouth at bedtime.  Dispense: 30 capsule; Refill: 1  2. PTSD (post-traumatic stress disorder)  - Vilazodone HCl 20 MG TABS; Take 1 tablet (20 mg total) by mouth daily.  Dispense: 30 tablet;  Refill: 1  3. Panic attacks  Pt scheduled to come in to clinic for buccal mucosa sample collection for pharmacogenetic testing next week.  Continue individual therapy.   Follow-up in 6 weeks.  Zena Amos, MD 02/26/2020, 1:43 PM

## 2020-03-03 ENCOUNTER — Telehealth (HOSPITAL_COMMUNITY): Payer: Self-pay | Admitting: *Deleted

## 2020-03-03 NOTE — Telephone Encounter (Signed)
Writer left VM for pt regarding Genesight testing appointment. Pt advise office closed from 11-1299 Monday thru Thursday, and closes at 1300 on Fridays. So pt advised to come between 1000 and 1130, or 1300 and 1500 (M-TH). Pt asked to call clinic with any questions or concerns.

## 2020-03-08 ENCOUNTER — Telehealth (HOSPITAL_COMMUNITY): Payer: Self-pay | Admitting: *Deleted

## 2020-03-08 NOTE — Telephone Encounter (Signed)
Writer left VM for pt regarding Genesight by mail. Writer informed pt kit will be sent to pt with full instructions and postage paid mail back. Pt reminded not to eat or drink anything for 30 minutes prior to testing. Pt encouraged to call with questions or concerns.

## 2020-03-19 ENCOUNTER — Telehealth: Payer: Self-pay

## 2020-03-19 NOTE — Telephone Encounter (Signed)
submitted the prior auth for the viibryd 20mg  = approved

## 2020-03-23 ENCOUNTER — Telehealth (HOSPITAL_COMMUNITY): Payer: Self-pay

## 2020-03-23 NOTE — Telephone Encounter (Signed)
Pt called requesting refill of abilify. Noted in chart pt had prescription with one refill that should be available. Writer called pharmacy to verify prescription available. Pharmacist states initial abilify prescription (sent 3/25) was not picked up and states she will fill it now per pt request.

## 2020-03-30 ENCOUNTER — Telehealth (HOSPITAL_COMMUNITY): Payer: Self-pay

## 2020-03-30 NOTE — Telephone Encounter (Signed)
Pt called stating she was having trouble getting abilify prescription. Writer called pharmacy to inquire. Pharmacist states insurance company will not cover medication unless filled through mail order pharmacy. Pharmacist requested writer call pt and let her know. Pt was contacted and relayed information provided by pharmacy. Pt verbalized understanding. Pt will call back with updated pharmacy one that information is obtained.

## 2020-04-08 ENCOUNTER — Encounter: Payer: Self-pay | Admitting: Psychiatry

## 2020-04-08 ENCOUNTER — Telehealth (INDEPENDENT_AMBULATORY_CARE_PROVIDER_SITE_OTHER): Payer: BC Managed Care – PPO | Admitting: Psychiatry

## 2020-04-08 ENCOUNTER — Other Ambulatory Visit: Payer: Self-pay

## 2020-04-08 DIAGNOSIS — F3131 Bipolar disorder, current episode depressed, mild: Secondary | ICD-10-CM

## 2020-04-08 DIAGNOSIS — F4310 Post-traumatic stress disorder, unspecified: Secondary | ICD-10-CM | POA: Diagnosis not present

## 2020-04-08 DIAGNOSIS — F410 Panic disorder [episodic paroxysmal anxiety] without agoraphobia: Secondary | ICD-10-CM

## 2020-04-08 MED ORDER — VENLAFAXINE HCL ER 75 MG PO CP24: 75.0000 mg | ORAL_CAPSULE | Freq: Every day | ORAL | 1 refills | Status: DC

## 2020-04-08 MED ORDER — ARIPIPRAZOLE 10 MG PO TABS: 10.0000 mg | ORAL_TABLET | Freq: Every day | ORAL | 1 refills | Status: DC

## 2020-04-08 MED ORDER — TEMAZEPAM 15 MG PO CAPS: 15.0000 mg | ORAL_CAPSULE | Freq: Every day | ORAL | 1 refills | Status: DC

## 2020-04-08 MED ORDER — HYDROXYZINE HCL 25 MG PO TABS: 25.0000 mg | ORAL_TABLET | Freq: Two times a day (BID) | ORAL | 1 refills | Status: DC | PRN

## 2020-04-08 NOTE — Progress Notes (Addendum)
[facility] MD OP Progress Note  I connected with  Dorothy Pierce on 04/08/20 by a video enabled telemedicine application and verified that I am speaking with the correct person using two identifiers.   I discussed the limitations of evaluation and management by telemedicine. The patient expressed understanding and agreed to proceed.    04/08/2020 3:16 PM Dorothy Pierce  MRN:  [id number]  Chief Complaint: " I feel the same. My biggest concerns are side effects."  HPI: Patient reports that Viibryd has been ineffective in targeting irritability and depressive symptoms. She underwent pharmacogenetic testing due to several medication failures.    Past meds tried are as followed: Lexapro, Zoloft, and Prozac. She reports that she did not like the way they made her feel, Wellbutrin- more irritable, Effexor- mild irritability and anorgasmia. Latest one is Viibryd- pt does not feel it helps much.   Today she was informed of the results from pharmacogenetic testing. She was informed that the majority of medications are safe to take as directed. She was also informed that results would be emailed to her. She endorsed understanding. She reports that she is concerned of sexual side effects and weight gain.  She was asked if she wanted to try Trintellix which was noted to not have sexual side effects or retry Effexor. She noted that Effexor was most effective in targeting symptoms of irritability and depression and was agreeable to restart medication.  She reports that Temazepam has been helpful with sleep and she would like to continue it. No other concerns noted at this time.           Visit Diagnosis:    ICD-10-CM   1. Bipolar 1 disorder, depressed, mild (HCC)  F31.31 ARIPiprazole (ABILIFY) 10 MG tablet    temazepam (RESTORIL) 15 MG capsule    venlafaxine XR (EFFEXOR-XR) 75 MG 24 hr capsule  2. PTSD (post-traumatic stress disorder)  F43.10 venlafaxine XR (EFFEXOR-XR) 75 MG 24 hr capsule  3. Panic attacks   F41.0 hydrOXYzine (ATARAX/VISTARIL) 25 MG tablet    Past Psychiatric History: bipolar d/o, panic attacks, PTSD  Past Medical History:  Past Medical History:  Diagnosis Date  . Bipolar 1 disorder (H[facility])   . HTN (hypertension)   . Obesity (BMI 30.0-34.9)    No past surgical history on file.  Family Psychiatric History: see below  Family History:  Family History  Problem Relation Age of Onset  . Hypertension Mother   . Depression Mother   . Hyperlipidemia Mother   . Thyroid disease Mother   . Allergies Sister   . Depression Sister   . Asthma Sister   . Hypertension Maternal Aunt   . Hyperlipidemia Maternal Aunt   . Arthritis Maternal Aunt   . Cancer Maternal Aunt        skin/ breast  . Hypertension Maternal Uncle   . Hyperlipidemia Maternal Uncle   . Hypertension Maternal Grandmother   . Hyperlipidemia Maternal Grandmother   . Asthma Maternal Grandmother   . Diabetes Maternal Grandmother   . Hypertension Maternal Grandfather   . Hyperlipidemia Maternal Grandfather   . Diabetes Maternal Grandfather     Social History:  Social History   Socioeconomic History  . Marital status: Single    Spouse name: Not on file  . Number of children: Not on file  . Years of education: Not on file  . Highest education level: Not on file  Occupational History  . Not on file  Tobacco Use  . Smoking status:  Former Smoker  . Smokeless tobacco: Never Used  Substance and Sexual Activity  . Alcohol use: Yes    Alcohol/week: 0.0 standard drinks    Comment: rarely. history of addiction  . Drug use: Yes    Types: Marijuana    Comment: former marijuana  . Sexual activity: Yes    Partners: Female, Female    Comment: single right now  Other Topics Concern  . Not on file  Social History Narrative  . Not on file   Social Determinants of Health   Financial Resource Strain:   . Difficulty of Paying Living Expenses:   Food Insecurity:   . Worried About Programme researcher, broadcasting/film/video in the Last  Year:   . Barista in the Last Year:   Transportation Needs:   . Freight forwarder (Medical):   Marland Kitchen Lack of Transportation (Non-Medical):   Physical Activity:   . Days of Exercise per Week:   . Minutes of Exercise per Session:   Stress:   . Feeling of Stress :   Social Connections:   . Frequency of Communication with Friends and Family:   . Frequency of Social Gatherings with Friends and Family:   . Attends Religious Services:   . Active Member of Clubs or Organizations:   . Attends Banker Meetings:   Marland Kitchen Marital Status:     Allergies: No Known Allergies  Metabolic Disorder Labs: No results found for: HGBA1C, MPG No results found for: PROLACTIN Lab Results  Component Value Date   CHOL 129 09/30/2015   TRIG 52 09/30/2015   HDL 43 (L) 09/30/2015   CHOLHDL 3.0 09/30/2015   VLDL 10 09/30/2015   LDLCALC 76 09/30/2015   Lab Results  Component Value Date   TSH 2.29 04/25/2017   TSH 1.652 09/30/2015    Therapeutic Level Labs: No results found for: LITHIUM No results found for: VALPROATE No components found for:  CBMZ  Current Medications: Current Outpatient Medications  Medication Sig Dispense Refill  . ARIPiprazole (ABILIFY) 10 MG tablet Take 1 tablet (10 mg total) by mouth daily. 30 tablet 1  . hydrOXYzine (ATARAX/VISTARIL) 25 MG tablet Take 1 tablet (25 mg total) by mouth 2 (two) times daily as needed for anxiety. 30 tablet 1  . temazepam (RESTORIL) 15 MG capsule Take 1 capsule (15 mg total) by mouth at bedtime. 30 capsule 1  . venlafaxine XR (EFFEXOR-XR) 75 MG 24 hr capsule Take 1 capsule (75 mg total) by mouth daily with breakfast. 30 capsule 1   No current facility-administered medications for this visit.     Musculoskeletal: Strength & Muscle Tone: unable to assess due to telemed visit Gait & Station: unable to assess due to telemed visit Patient leans: unable to assess due to telemed visit  Psychiatric Specialty Exam: Review of  Systems  There were no vitals taken for this visit.There is no height or weight on file to calculate BMI.  General Appearance: Fairly Groomed  Eye Contact:  Good  Speech:  Clear and Coherent and Normal Rate  Volume:  Normal  Mood:  Slightly depressed  Affect:  Congruent  Thought Process:  Goal Directed, Linear and Descriptions of Associations: Intact  Orientation:  Full (Time, Place, and Person)  Thought Content: Logical   Suicidal Thoughts:  No  Homicidal Thoughts:  No  Memory:  Recent;   Good Remote;   Good  Judgement:  Fair  Insight:  Fair  Psychomotor Activity:  Normal  Concentration:  Concentration:  Good and Attention Span: Good  Recall:  Good  Fund of Knowledge: Good  Language: Good  Akathisia:  Negative  Handed:  Right  AIMS (if indicated): not done due to telemed visit  Assets:  Communication Skills Desire for Improvement Financial Resources/Insurance Housing  ADL's:  Intact  Cognition: WNL  Sleep:  Improved with Temazepam   Screenings: GAD-7     Office Visit from 04/25/2017 in [facility]  Total GAD-7 Score  18    PHQ2-9     Office Visit from 04/25/2017 in [facility] Visit from 09/17/2015 in [facility]  PHQ-2 Total Score  4  2  PHQ-9 Total Score  23  8       Assessment and Plan: Patient is reporting feeling the same with irritability and depression while on Viibryd. Although concerned about sexual side effects she is agreeable to retying Effexor as it has been more effective in the past compared to other medications.    1. Bipolar 1 disorder, depressed, mild ([facility])  - ARIPiprazole (ABILIFY) 10 MG tablet; Take 1 tablet (10 mg total) by mouth daily.  Dispense: 30 tablet; Refill: 1 - temazepam (RESTORIL) 15 MG capsule; Take 1 capsule (15 mg total) by mouth at bedtime.  Dispense: 30 capsule; Refill: 1 - Restart venlafaxine XR (EFFEXOR-XR) 75 MG 24 hr capsule; Take 1 capsule (75 mg total) by mouth daily with  breakfast.  Dispense: 30 capsule; Refill: 1  2. PTSD (post-traumatic stress disorder)  - venlafaxine XR (EFFEXOR-XR) 75 MG 24 hr capsule; Take 1 capsule (75 mg total) by mouth daily with breakfast.  Dispense: 30 capsule; Refill: 1  3. Panic attacks  - hydrOXYzine (ATARAX/VISTARIL) 25 MG tablet; Take 1 tablet (25 mg total) by mouth 2 (two) times daily as needed for anxiety.  Dispense: 30 tablet; Refill: 1   Follow up in 6 weeks.  Salley Slaughter, NP 04/08/2020, 3:16 PM    I saw and managed the pt with NP B. Ronne Binning.  [place] Crane, MD 04/08/2020 3:52 PM

## 2020-04-12 ENCOUNTER — Telehealth: Payer: Self-pay

## 2020-04-12 NOTE — Telephone Encounter (Signed)
pt needs a prior auth for temazepam 15mg  prior auth submitted and is pending

## 2020-04-13 ENCOUNTER — Telehealth: Payer: Self-pay

## 2020-04-13 NOTE — Telephone Encounter (Signed)
the prior auth was denied.- the maximum quantity allowed is 15 capsules per month of restoril,.

## 2020-04-13 NOTE — Telephone Encounter (Signed)
left message that insurance only cover 15 capsules of the restoril

## 2020-04-13 NOTE — Telephone Encounter (Signed)
Okay, please call the pt and let her know that insurance will only cover 15 tabs per month. Thanks.

## 2020-05-20 ENCOUNTER — Telehealth (INDEPENDENT_AMBULATORY_CARE_PROVIDER_SITE_OTHER): Payer: BC Managed Care – PPO | Admitting: Psychiatry

## 2020-05-20 ENCOUNTER — Other Ambulatory Visit: Payer: Self-pay

## 2020-05-20 ENCOUNTER — Encounter (HOSPITAL_COMMUNITY): Payer: Self-pay | Admitting: Psychiatry

## 2020-05-20 ENCOUNTER — Telehealth: Payer: BC Managed Care – PPO | Admitting: Psychiatry

## 2020-05-20 DIAGNOSIS — F410 Panic disorder [episodic paroxysmal anxiety] without agoraphobia: Secondary | ICD-10-CM

## 2020-05-20 DIAGNOSIS — F319 Bipolar disorder, unspecified: Secondary | ICD-10-CM

## 2020-05-20 DIAGNOSIS — F3131 Bipolar disorder, current episode depressed, mild: Secondary | ICD-10-CM | POA: Diagnosis not present

## 2020-05-20 DIAGNOSIS — F4310 Post-traumatic stress disorder, unspecified: Secondary | ICD-10-CM

## 2020-05-20 MED ORDER — HYDROXYZINE HCL 25 MG PO TABS: 25.0000 mg | ORAL_TABLET | Freq: Two times a day (BID) | ORAL | 1 refills | Status: DC | PRN

## 2020-05-20 MED ORDER — VENLAFAXINE HCL ER 75 MG PO CP24: 75.0000 mg | ORAL_CAPSULE | Freq: Every day | ORAL | 1 refills | Status: DC

## 2020-05-20 MED ORDER — TEMAZEPAM 15 MG PO CAPS: 15.0000 mg | ORAL_CAPSULE | Freq: Every day | ORAL | 1 refills | Status: DC

## 2020-05-20 MED ORDER — ARIPIPRAZOLE 10 MG PO TABS: 10.0000 mg | ORAL_TABLET | Freq: Every day | ORAL | 1 refills | Status: DC

## 2020-05-20 NOTE — Progress Notes (Signed)
BH MD OP Progress Note  Virtual Visit via Video Note  I connected with Dorothy Pierce on 05/20/20 at  3:00 PM EDT by a video enabled telemedicine application and verified that I am speaking with the correct person using two identifiers.  Location: Patient: Home Provider: Clinic   I discussed the limitations of evaluation and management by telemedicine and the availability of in person appointments. The patient expressed understanding and agreed to proceed.  I provided 14 minutes of non-face-to-face time during this encounter.     05/20/2020 3:03 PM Dorothy Pierce  MRN:  [id number]  Chief Complaint: " Everything is going good."  HPI: Patient reported that she is doing well.  She stated that she likes the current combination of medications.  She said that she feels pretty even.  She feels her mood is well controlled and she is not as irritable as she was.  She denied any significant side effects to any of her medication.  She informed that she is sleeping well at night. She reported that she has not needed to use hydroxyzine for breakthrough anxiety much lately.  Past meds tried are as followed: Lexapro, Zoloft, and Prozac. She reports that she did not like the way they made her feel, Wellbutrin- more irritable, latest Viibryd- pt felt it did not help much.   Visit Diagnosis:    ICD-10-CM   1. Bipolar 1 disorder (HCC)  F31.9   2. PTSD (post-traumatic stress disorder)  F43.10   3. Panic attacks  F41.0     Past Psychiatric History: bipolar d/o, panic attacks, PTSD  Past Medical History:  Past Medical History:  Diagnosis Date  . Bipolar 1 disorder (HCC)   . HTN (hypertension)   . Obesity (BMI 30.0-34.9)    No past surgical history on file.  Family Psychiatric History: see below  Family History:  Family History  Problem Relation Age of Onset  . Hypertension Mother   . Depression Mother   . Hyperlipidemia Mother   . Thyroid disease Mother   . Allergies Sister   .  Depression Sister   . Asthma Sister   . Hypertension Maternal Aunt   . Hyperlipidemia Maternal Aunt   . Arthritis Maternal Aunt   . Cancer Maternal Aunt        skin/ breast  . Hypertension Maternal Uncle   . Hyperlipidemia Maternal Uncle   . Hypertension Maternal Grandmother   . Hyperlipidemia Maternal Grandmother   . Asthma Maternal Grandmother   . Diabetes Maternal Grandmother   . Hypertension Maternal Grandfather   . Hyperlipidemia Maternal Grandfather   . Diabetes Maternal Grandfather     Social History:  Social History   Socioeconomic History  . Marital status: Single    Spouse name: Not on file  . Number of children: Not on file  . Years of education: Not on file  . Highest education level: Not on file  Occupational History  . Not on file  Tobacco Use  . Smoking status: Former Games developer  . Smokeless tobacco: Never Used  Substance and Sexual Activity  . Alcohol use: Yes    Alcohol/week: 0.0 standard drinks    Comment: rarely. history of addiction  . Drug use: Yes    Types: Marijuana    Comment: former marijuana  . Sexual activity: Yes    Partners: Female, Female    Comment: single right now  Other Topics Concern  . Not on file  Social History Narrative  . Not on file  Social Determinants of Health   Financial Resource Strain:   . Difficulty of Paying Living Expenses:   Food Insecurity:   . Worried About Programme researcher, broadcasting/film/video in the Last Year:   . Barista in the Last Year:   Transportation Needs:   . Freight forwarder (Medical):   Marland Kitchen Lack of Transportation (Non-Medical):   Physical Activity:   . Days of Exercise per Week:   . Minutes of Exercise per Session:   Stress:   . Feeling of Stress :   Social Connections:   . Frequency of Communication with Friends and Family:   . Frequency of Social Gatherings with Friends and Family:   . Attends Religious Services:   . Active Member of Clubs or Organizations:   . Attends Banker  Meetings:   Marland Kitchen Marital Status:     Allergies: No Known Allergies  Metabolic Disorder Labs: No results found for: HGBA1C, MPG No results found for: PROLACTIN Lab Results  Component Value Date   CHOL 129 09/30/2015   TRIG 52 09/30/2015   HDL 43 (L) 09/30/2015   CHOLHDL 3.0 09/30/2015   VLDL 10 09/30/2015   LDLCALC 76 09/30/2015   Lab Results  Component Value Date   TSH 2.29 04/25/2017   TSH 1.652 09/30/2015    Therapeutic Level Labs: No results found for: LITHIUM No results found for: VALPROATE No components found for:  CBMZ  Current Medications: Current Outpatient Medications  Medication Sig Dispense Refill  . ARIPiprazole (ABILIFY) 10 MG tablet Take 1 tablet (10 mg total) by mouth daily. 30 tablet 1  . hydrOXYzine (ATARAX/VISTARIL) 25 MG tablet Take 1 tablet (25 mg total) by mouth 2 (two) times daily as needed for anxiety. 30 tablet 1  . temazepam (RESTORIL) 15 MG capsule Take 1 capsule (15 mg total) by mouth at bedtime. 30 capsule 1  . venlafaxine XR (EFFEXOR-XR) 75 MG 24 hr capsule Take 1 capsule (75 mg total) by mouth daily with breakfast. 30 capsule 1   No current facility-administered medications for this visit.     Musculoskeletal: Strength & Muscle Tone: unable to assess due to telemed visit Gait & Station: unable to assess due to telemed visit Patient leans: unable to assess due to telemed visit  Psychiatric Specialty Exam: Review of Systems  There were no vitals taken for this visit.There is no height or weight on file to calculate BMI.  General Appearance: Fairly Groomed  Eye Contact:  Good  Speech:  Clear and Coherent and Normal Rate  Volume:  Normal  Mood:  Euthymic  Affect:  Congruent  Thought Process:  Goal Directed, Linear and Descriptions of Associations: Intact  Orientation:  Full (Time, Place, and Person)  Thought Content: Logical   Suicidal Thoughts:  No  Homicidal Thoughts:  No  Memory:  Recent;   Good Remote;   Good  Judgement:  Fair   Insight:  Fair  Psychomotor Activity:  Normal  Concentration:  Concentration: Good and Attention Span: Good  Recall:  Good  Fund of Knowledge: Good  Language: Good  Akathisia:  Negative  Handed:  Right  AIMS (if indicated): not done due to telemed visit  Assets:  Communication Skills Desire for Improvement Financial Resources/Insurance Housing  ADL's:  Intact  Cognition: WNL  Sleep:  Improved with Temazepam   Screenings: GAD-7     Office Visit from 04/25/2017 in [facility]  Total GAD-7 Score 18    PHQ2-9  Office Visit from 04/25/2017 in [place] Visit from 09/17/2015 in [place]  PHQ-2 Total Score 4 2  PHQ-9 Total Score 23 8       Assessment and Plan: Patient appears to be stable on her current regimen.  Continue same medications for now.  1. Bipolar 1 disorder, depressed, mild (HCC)  - ARIPiprazole (ABILIFY) 10 MG tablet; Take 1 tablet (10 mg total) by mouth daily.  Dispense: 30 tablet; Refill: 1 - temazepam (RESTORIL) 15 MG capsule; Take 1 capsule (15 mg total) by mouth at bedtime.  Dispense: 30 capsule; Refill: 1 - Venlafaxine XR (EFFEXOR-XR) 75 MG 24 hr capsule; Take 1 capsule (75 mg total) by mouth daily with breakfast.  Dispense: 30 capsule; Refill: 1  2. PTSD (post-traumatic stress disorder)  - venlafaxine XR (EFFEXOR-XR) 75 MG 24 hr capsule; Take 1 capsule (75 mg total) by mouth daily with breakfast.  Dispense: 30 capsule; Refill: 1  3. Panic attacks  - hydrOXYzine (ATARAX/VISTARIL) 25 MG tablet; Take 1 tablet (25 mg total) by mouth 2 (two) times daily as needed for anxiety.  Dispense: 30 tablet; Refill: 1  Continue same regimen.  Follow up in 2 months.  N[place] Crane, MD 05/20/2020, 3:03 PM

## 2020-05-21 ENCOUNTER — Telehealth (HOSPITAL_COMMUNITY): Payer: Self-pay | Admitting: *Deleted

## 2020-05-21 NOTE — Telephone Encounter (Signed)
[facility] FAXED THE REQUEST FOR COVERAGE  temazepam (RESTORIL) 15 MG capsule 30 capsule   HAS BEEN DENIED BECAUSE PATIENT'S PLAN APPROVED [employer] AGENTS CRITERIA COVERS UP TO  15 CAPSULES PER MONTH FOR temazepam (RESTORIL)

## 2020-05-21 NOTE — Telephone Encounter (Signed)
SPOKE TO PATIENT TO INFORM PER PROVIDER :: Let the pt know that insurance will only cover 15 capsules per month. She may have to pay for the rest of the 15 pills from her pocket. I sent 30 capsules per month prescription

## 2020-05-21 NOTE — Telephone Encounter (Signed)
Let the pt know that insurance will only cover 15 capsules per month. She may have to pay for the rest of the 15 pills from her pocket. I sent 30 capsules per month prescription. Thanks.

## 2020-06-08 ENCOUNTER — Other Ambulatory Visit: Payer: Self-pay

## 2020-06-08 ENCOUNTER — Encounter (HOSPITAL_COMMUNITY): Payer: Self-pay

## 2020-06-08 ENCOUNTER — Ambulatory Visit (HOSPITAL_COMMUNITY)
Admission: EM | Admit: 2020-06-08 | Discharge: 2020-06-08 | Disposition: A | Discharge status: Home / Self Care | Payer: BC Managed Care – PPO | Attending: Family Medicine | Admitting: Family Medicine

## 2020-06-08 DIAGNOSIS — M545 Low back pain, unspecified: Secondary | ICD-10-CM

## 2020-06-08 MED ORDER — KETOROLAC TROMETHAMINE 30 MG/ML IJ SOLN
INTRAMUSCULAR | Status: AC
  Filled 2020-06-08: qty 1

## 2020-06-08 MED ORDER — CYCLOBENZAPRINE HCL 5 MG PO TABS: 5.0000 mg | ORAL_TABLET | Freq: Two times a day (BID) | ORAL | 0 refills | Status: DC | PRN

## 2020-06-08 MED ORDER — PREDNISONE 10 MG PO TABS: ORAL_TABLET | ORAL | 0 refills | Status: DC

## 2020-06-08 MED ORDER — KETOROLAC TROMETHAMINE 30 MG/ML IJ SOLN
30.0000 mg | Freq: Once | INTRAMUSCULAR | Status: AC
  Administered 2020-06-08: 30 mg via INTRAMUSCULAR

## 2020-06-08 NOTE — ED Triage Notes (Signed)
Pt presents with lower back pain X 1 week.  

## 2020-06-08 NOTE — Discharge Instructions (Signed)
We gave you an injection of Toradol for your back Begin prednisone taper over the next 6 days-begin with 6 tabs/60 mg on day 1, decrease by 1 tablet each day until complete-6, 5, 4, 3, 2, 1 You may use flexeril as needed to help with pain. This is a muscle relaxer and causes sedation- please use only at bedtime or when you will be home and not have to drive/work Alternate ice and heat to back Gentle stretching Avoid complete bedrest, avoid heavy lifting Follow-up if not improving or worse

## 2020-06-09 NOTE — ED Provider Notes (Signed)
[facility]    CSN: [id number] Arrival date & time: 06/08/20  1828      History   Chief Complaint Chief Complaint  Patient presents with  . Back Pain    HPI Dorothy Pierce is a 34 y.o. female history of bipolar type I, hypertension, presenting today for evaluation of back pain.  Patient reports that over the past week she has had back pain.  Denies any specific injury or trauma.  Does report that she has been trying to be more active most recently and has been doing more walking.  Pain is mainly in her lower back feels worse with movement towards right side of body.  Denies radiation into legs.  Denies numbness or tingling.  Denies saddle anesthesia.  Denies urinary symptoms.  Denies loss of control of bowel/bladder.  Has been using ibuprofen 800 without relief.  Has previously strained her back and feels similar.  Reports improvement with injection before, but unsure name of medicine.  HPI  Past Medical History:  Diagnosis Date  . Bipolar 1 disorder (HCC)   . HTN (hypertension)   . Obesity (BMI 30.0-34.9)     Patient Active Problem List   Diagnosis Date Noted  . Bipolar 1 disorder, depressed, mild (HCC) 12/19/2019  . Panic attacks 11/04/2019  . PTSD (post-traumatic stress disorder) 11/04/2019  . Bipolar 1 disorder (HCC)   . Hypertension 12/15/2015  . GERD (gastroesophageal reflux disease) 12/15/2015  . Obesity, morbid, BMI 50 or higher (HCC) 09/30/2015    History reviewed. No pertinent surgical history.  OB History   No obstetric history on file.      Home Medications    Prior to Admission medications   Medication Sig Start Date End Date Taking? Authorizing Provider  ARIPiprazole (ABILIFY) 10 MG tablet Take 1 tablet (10 mg total) by mouth daily. 05/20/20   Zena Amos, MD  cyclobenzaprine (FLEXERIL) 5 MG tablet Take 1-2 tablets (5-10 mg total) by mouth 2 (two) times daily as needed for muscle spasms. 06/08/20   Aashish Hamm C, PA-C  hydrOXYzine  (ATARAX/VISTARIL) 25 MG tablet Take 1 tablet (25 mg total) by mouth 2 (two) times daily as needed for anxiety. 05/20/20   Zena Amos, MD  predniSONE (DELTASONE) 10 MG tablet Begin with 6 tabs on day 1, 5 tab on day 2, 4 tab on day 3, 3 tab on day 4, 2 tab on day 5, 1 tab on day 6-take with food 06/08/20   Jacorion Klem C, PA-C  temazepam (RESTORIL) 15 MG capsule Take 1 capsule (15 mg total) by mouth at bedtime. 05/20/20   Zena Amos, MD  venlafaxine XR (EFFEXOR-XR) 75 MG 24 hr capsule Take 1 capsule (75 mg total) by mouth daily with breakfast. 05/20/20   Zena Amos, MD    Family History Family History  Problem Relation Age of Onset  . Hypertension Mother   . Depression Mother   . Hyperlipidemia Mother   . Thyroid disease Mother   . Allergies Sister   . Depression Sister   . Asthma Sister   . Hypertension Maternal Aunt   . Hyperlipidemia Maternal Aunt   . Arthritis Maternal Aunt   . Cancer Maternal Aunt        skin/ breast  . Hypertension Maternal Uncle   . Hyperlipidemia Maternal Uncle   . Hypertension Maternal Grandmother   . Hyperlipidemia Maternal Grandmother   . Asthma Maternal Grandmother   . Diabetes Maternal Grandmother   . Hypertension Maternal Grandfather   .  Hyperlipidemia Maternal Grandfather   . Diabetes Maternal Grandfather     Social History Social History   Tobacco Use  . Smoking status: Former Games developer  . Smokeless tobacco: Never Used  Substance Use Topics  . Alcohol use: Yes    Alcohol/week: 0.0 standard drinks    Comment: rarely. history of addiction  . Drug use: Yes    Types: Marijuana    Comment: former marijuana     Allergies   Patient has no known allergies.   Review of Systems Review of Systems  Constitutional: Negative for fatigue and fever.  HENT: Negative for mouth sores.   Eyes: Negative for visual disturbance.  Respiratory: Negative for shortness of breath.   Cardiovascular: Negative for chest pain.  Gastrointestinal: Negative  for abdominal pain, nausea and vomiting.  Musculoskeletal: Positive for back pain and myalgias. Negative for arthralgias and joint swelling.  Skin: Negative for color change, rash and wound.  Neurological: Negative for dizziness, weakness, light-headedness and headaches.     Physical Exam Triage Vital Signs ED Triage Vitals  Enc Vitals Group     BP 06/08/20 1840 128/79     Pulse Rate 06/08/20 1840 88     Resp 06/08/20 1840 18     Temp 06/08/20 1840 98.3 F (36.8 C)     Temp Source 06/08/20 1840 Oral     SpO2 06/08/20 1840 95 %     Weight --      Height --      Head Circumference --      Peak Flow --      Pain Score 06/08/20 1839 9     Pain Loc --      Pain Edu? --      Excl. in GC? --    No data found.  Updated Vital Signs BP 128/79 (BP Location: Right Arm)   Pulse 88   Temp 98.3 F (36.8 C) (Oral)   Resp 18   LMP 05/08/2020   SpO2 95%   Visual Acuity Right Eye Distance:   Left Eye Distance:   Bilateral Distance:    Right Eye Near:   Left Eye Near:    Bilateral Near:     Physical Exam Vitals and nursing note reviewed.  Constitutional:      Appearance: She is well-developed.     Comments: No acute distress  HENT:     Head: Normocephalic and atraumatic.     Nose: Nose normal.  Eyes:     Conjunctiva/sclera: Conjunctivae normal.  Cardiovascular:     Rate and Rhythm: Normal rate.  Pulmonary:     Effort: Pulmonary effort is normal. No respiratory distress.  Abdominal:     General: There is no distension.  Musculoskeletal:        General: Normal range of motion.     Cervical back: Neck supple.     Comments: Tender to palpation to lower lumbar spine midline, no palpable deformity or step-off, mild tenderness to bilateral paraspinal musculature of lumbar area, nontender throughout lateral lumbar areas  Hip and knee strength 5/5 and equal bilaterally  Ambulates from chair to exam table independently and without abnormality  Skin:    General: Skin is warm  and dry.  Neurological:     Mental Status: She is alert and oriented to person, place, and time.      UC Treatments / Results  Labs (all labs ordered are listed, but only abnormal results are displayed) Labs Reviewed - No data to display  EKG   Radiology No results found.  Procedures Procedures (including critical care time)  Medications Ordered in UC Medications  ketorolac (TORADOL) 30 MG/ML injection 30 mg (30 mg Intramuscular Given 06/08/20 1907)    Initial Impression / Assessment and Plan / UC Course  I have reviewed the triage vital signs and the nursing notes.  Pertinent labs & imaging results that were available during my care of the patient were reviewed by me and considered in my medical decision making (see chart for details).     Suspect lumbar strain, no mechanism of injury, do not suspect acute bony abnormality.  Recommending anti-inflammatories muscle relaxers, activity modification, ice and heat.  Monitor for gradual improvement.  Providing Toradol prior to discharge, placing on steroids given has been using NSAIDs without relief.  Discussed strict return precautions. Patient verbalized understanding and is agreeable with plan.  Final Clinical Impressions(s) / UC Diagnoses   Final diagnoses:  Acute midline low back pain without sciatica     Discharge Instructions     We gave you an injection of Toradol for your back Begin prednisone taper over the next 6 days-begin with 6 tabs/60 mg on day 1, decrease by 1 tablet each day until complete-6, 5, 4, 3, 2, 1 You may use flexeril as needed to help with pain. This is a muscle relaxer and causes sedation- please use only at bedtime or when you will be home and not have to drive/work Alternate ice and heat to back Gentle stretching Avoid complete bedrest, avoid heavy lifting Follow-up if not improving or worse   ED Prescriptions    Medication Sig Dispense Auth. Provider   predniSONE (DELTASONE) 10 MG  tablet Begin with 6 tabs on day 1, 5 tab on day 2, 4 tab on day 3, 3 tab on day 4, 2 tab on day 5, 1 tab on day 6-take with food 21 tablet Dawson Albers C, PA-C   cyclobenzaprine (FLEXERIL) 5 MG tablet Take 1-2 tablets (5-10 mg total) by mouth 2 (two) times daily as needed for muscle spasms. 24 tablet Dakiya Puopolo, Wh[place] C, PA-C     PDMP not reviewed this encounter.   Sharyon Cable Emmett C, PA-C 06/09/20 1407

## 2020-07-14 ENCOUNTER — Ambulatory Visit (HOSPITAL_COMMUNITY): Payer: BC Managed Care – PPO | Admitting: Psychiatry

## 2021-05-11 IMAGING — CR DG CHEST 2V
2 series · 2 of 2 positions shown · non-contrast
Comparison: Radiograph 05/08/2016

CLINICAL DATA: Chest pain since this weekend

EXAM:
CHEST - 2 VIEW

[chest pa]
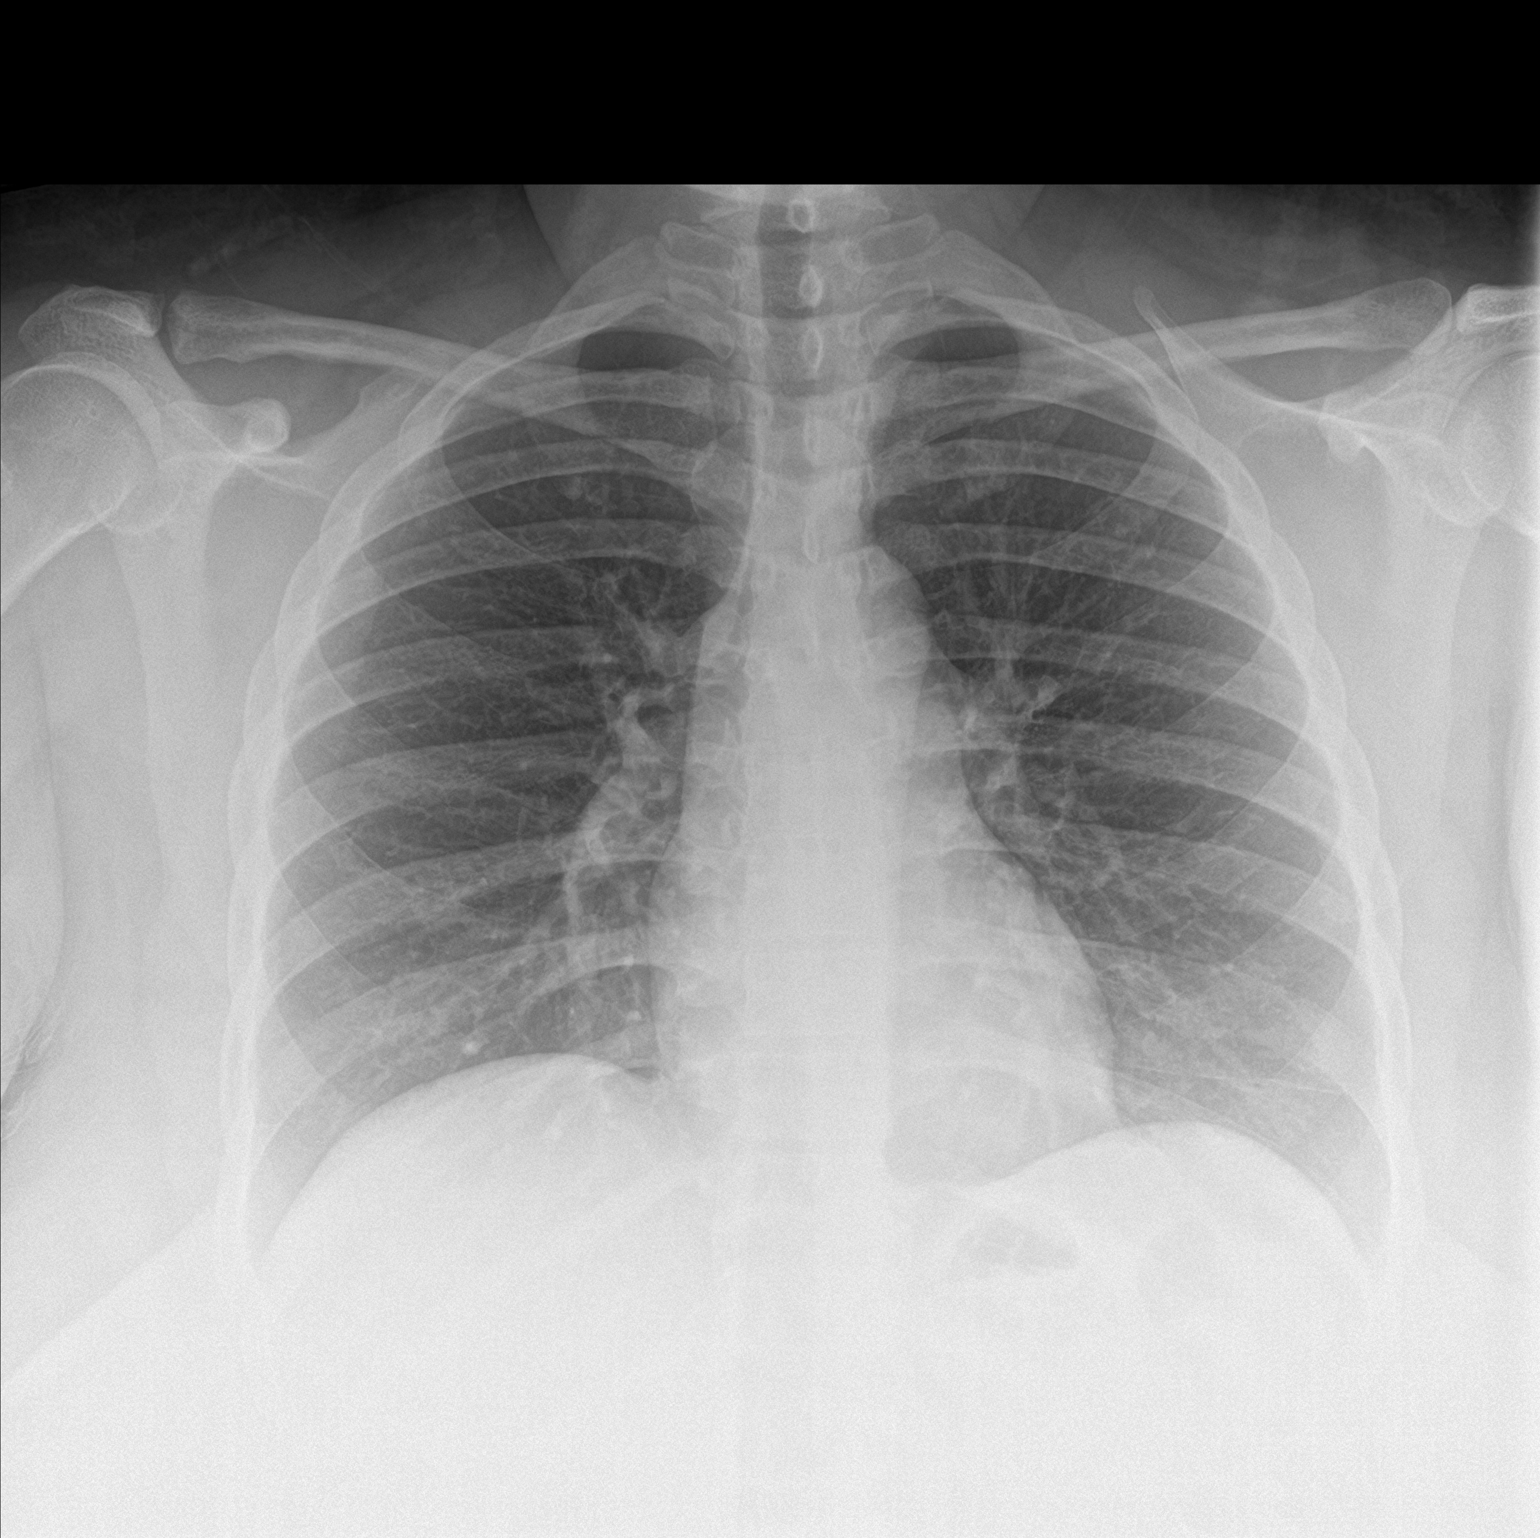

[chest lat]
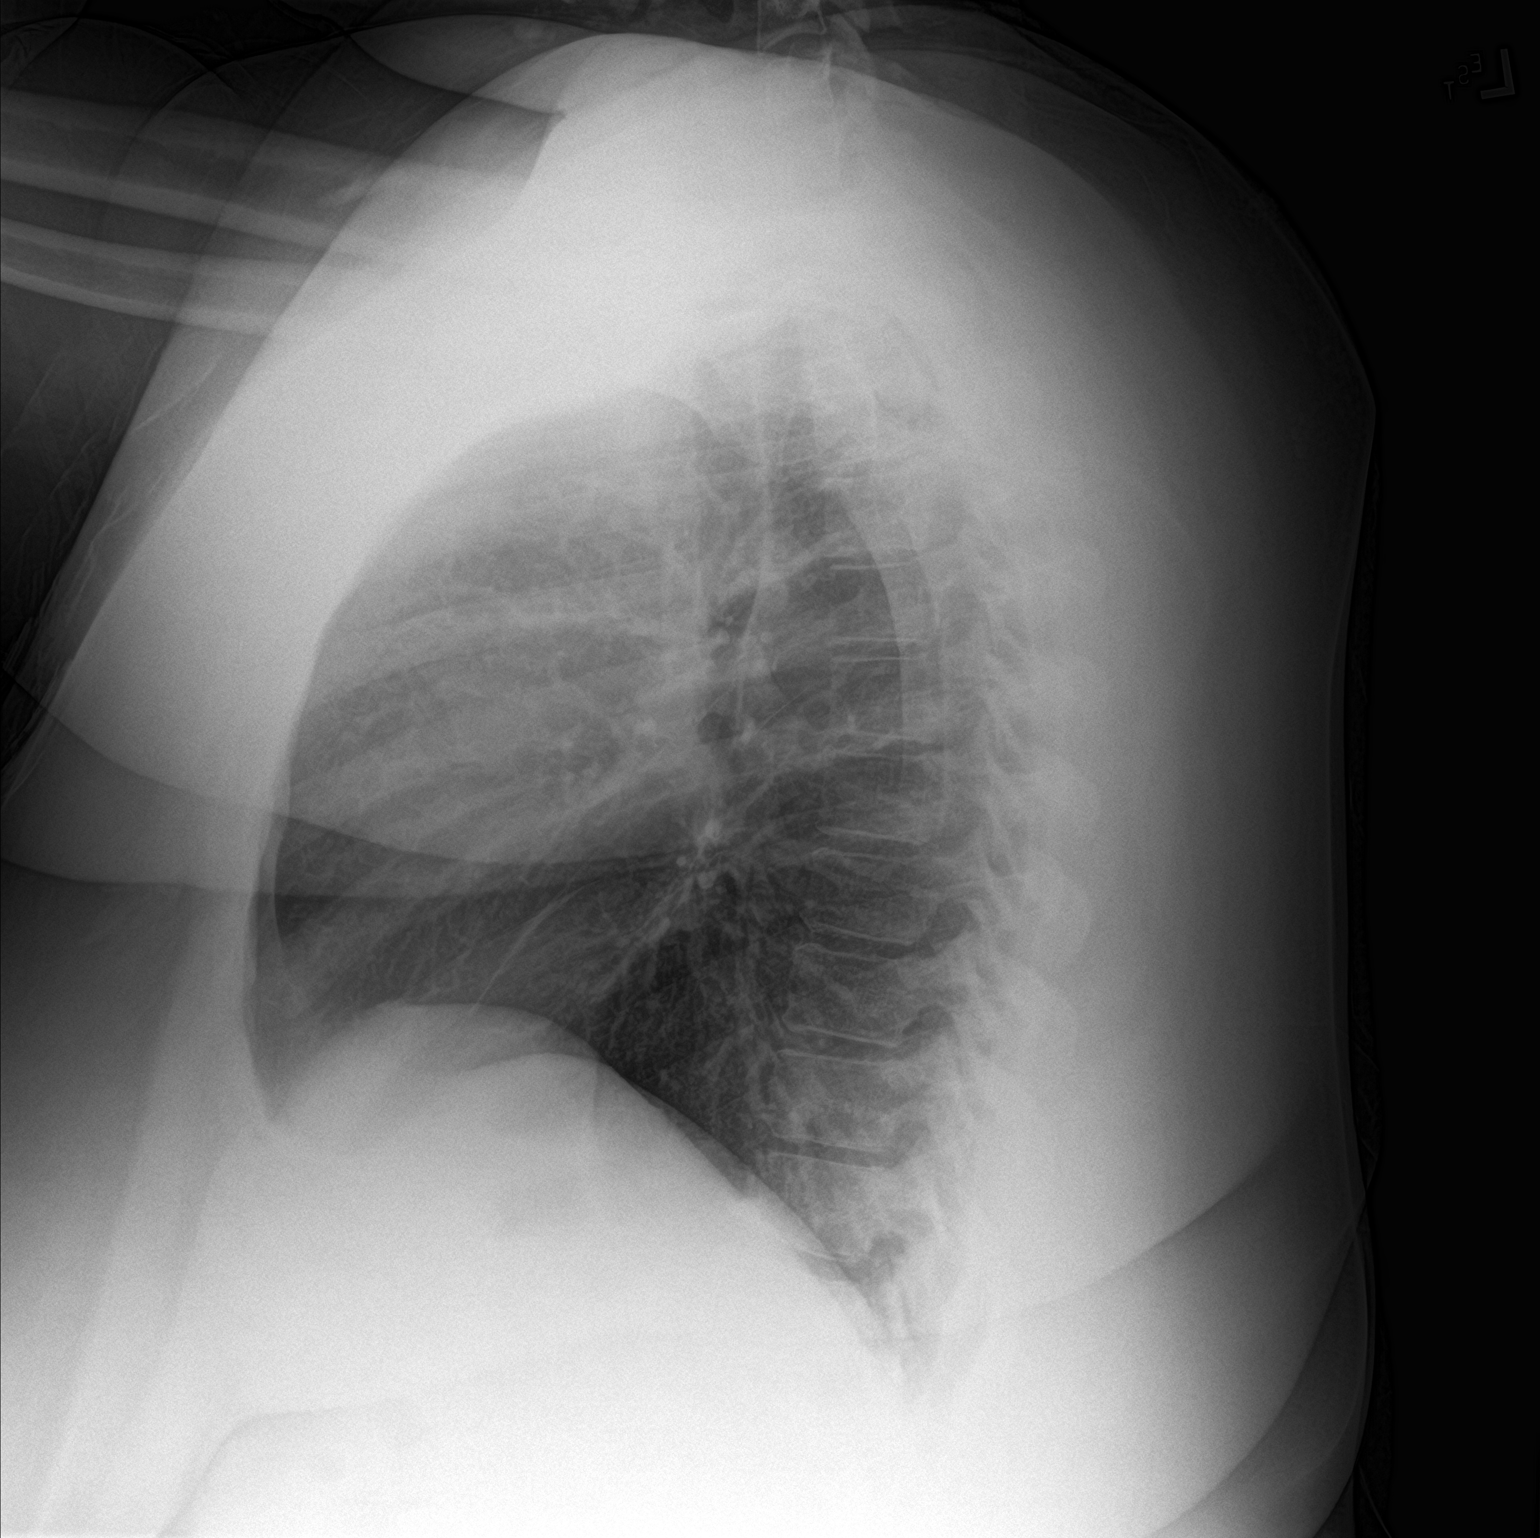

[2 of 2 positions shown; findings below may reference images not displayed]

FINDINGS: No consolidation, features of edema, pneumothorax, or effusion.
Pulmonary vascularity is normally distributed. The cardiomediastinal
contours are unremarkable. No acute osseous or soft tissue
abnormality.
IMPRESSION: No acute cardiopulmonary abnormality.

## 2021-05-13 DIAGNOSIS — F3132 Bipolar disorder, current episode depressed, moderate: Secondary | ICD-10-CM | POA: Diagnosis not present

## 2021-05-13 DIAGNOSIS — F411 Generalized anxiety disorder: Secondary | ICD-10-CM | POA: Diagnosis not present

## 2021-10-17 DIAGNOSIS — F3132 Bipolar disorder, current episode depressed, moderate: Secondary | ICD-10-CM | POA: Diagnosis not present

## 2021-10-17 DIAGNOSIS — F411 Generalized anxiety disorder: Secondary | ICD-10-CM | POA: Diagnosis not present

## 2021-10-18 DIAGNOSIS — Z0001 Encounter for general adult medical examination with abnormal findings: Secondary | ICD-10-CM | POA: Diagnosis not present

## 2021-10-18 DIAGNOSIS — Z13220 Encounter for screening for lipoid disorders: Secondary | ICD-10-CM | POA: Diagnosis not present

## 2021-10-18 DIAGNOSIS — Z113 Encounter for screening for infections with a predominantly sexual mode of transmission: Secondary | ICD-10-CM | POA: Diagnosis not present

## 2021-11-14 DIAGNOSIS — F411 Generalized anxiety disorder: Secondary | ICD-10-CM | POA: Diagnosis not present

## 2021-11-14 DIAGNOSIS — F3130 Bipolar disorder, current episode depressed, mild or moderate severity, unspecified: Secondary | ICD-10-CM | POA: Diagnosis not present

## 2022-01-09 DIAGNOSIS — F3131 Bipolar disorder, current episode depressed, mild: Secondary | ICD-10-CM | POA: Diagnosis not present

## 2022-01-09 DIAGNOSIS — F411 Generalized anxiety disorder: Secondary | ICD-10-CM | POA: Diagnosis not present

## 2022-01-16 ENCOUNTER — Encounter: Payer: Self-pay | Admitting: Obstetrics & Gynecology

## 2022-01-16 ENCOUNTER — Other Ambulatory Visit: Payer: Self-pay

## 2022-01-16 ENCOUNTER — Ambulatory Visit (INDEPENDENT_AMBULATORY_CARE_PROVIDER_SITE_OTHER): Payer: BC Managed Care – PPO | Admitting: Obstetrics & Gynecology

## 2022-01-16 VITALS — BP 136/90 | HR 77 | Ht 65.0 in | Wt >= 6400 oz

## 2022-01-16 DIAGNOSIS — N939 Abnormal uterine and vaginal bleeding, unspecified: Secondary | ICD-10-CM | POA: Diagnosis not present

## 2022-01-16 MED ORDER — TRANEXAMIC ACID 650 MG PO TABS: 1300.0000 mg | ORAL_TABLET | Freq: Three times a day (TID) | ORAL | 2 refills | Status: DC

## 2022-01-16 MED ORDER — NAPROXEN 500 MG PO TABS: 500.0000 mg | ORAL_TABLET | Freq: Two times a day (BID) | ORAL | 3 refills | Status: DC

## 2022-01-16 NOTE — Progress Notes (Signed)
GYNECOLOGY OFFICE VISIT NOTE  History:   Dorothy Pierce is a 36 y.o. G0 here today for evaluation and management for AUB.  She reports having regular, light periods until 2 years ago when she noticed she would have heavy periods every 3-4 months.  Periods used to last for three days, now they last 7-10 days, and associated with heavy bleeding needing pad changes 5-6 times per day, lower abdominal cramping and fatigue. Currently having a period now, it is heavy.  Moderate pain currently. No lightheadedness or dizziness. She is sexually active with female partner only.  She denies any other concerns.    Past Medical History:  Diagnosis Date   Bipolar 1 disorder (HCC)    HTN (hypertension)    Obesity (BMI 30.0-34.9)     History reviewed. No pertinent surgical history.  The following portions of the patient's history were reviewed and updated as appropriate: allergies, current medications, past family history, past medical history, past social history, past surgical history and problem list.   Health Maintenance:  Normal pap and negative HRHPV on 08/16/2018 at [facility].    Review of Systems:  Pertinent items noted in HPI and remainder of comprehensive ROS otherwise negative.  Physical Exam:  BP 136/90    Pulse 77    Ht 5\' 5"  (1.651 m)    Wt (!) 406 lb (184.2 kg)    LMP 01/09/2022 (Exact Date)    BMI 67.56 kg/m  CONSTITUTIONAL: Well-developed, well-nourished female in no acute distress.  HEENT:  Normocephalic, atraumatic. External right and left ear normal. No scleral icterus.  NECK: Normal range of motion, supple, no masses noted on observation SKIN: No rash noted. Not diaphoretic. No erythema. No pallor. MUSCULOSKELETAL: Normal range of motion. No edema noted. NEUROLOGIC: Alert and oriented to person, place, and time. Normal muscle tone coordination. No cranial nerve deficit noted. PSYCHIATRIC: Normal mood and affect. Normal behavior. Normal judgment and thought  content. CARDIOVASCULAR: Normal heart rate noted RESPIRATORY: Effort and breath sounds normal, no problems with respiration noted ABDOMEN: Obese. No masses noted. No other overt distention noted.   PELVIC: Deferred by patient     Assessment and Plan:     1. Obesity, morbid, BMI 50 or higher (HCC) 2. Abnormal uterine bleeding (AUB) Bleeding pattern is consistent with anovulatory pattern, patient has hormonal imbalance likely secondary to morbid obesity.   Discussed that obesity is linked to endometrial pathology given that adipose cells produce extra estrogen (estrone) which can cause the endometrium to have a significant amount of estrogen exposure . Unopposed estrogen exposure can lead to thickened or proliferative endometrium; and it is possible correlated with endometrial hyperplasia/carcinoma.  Weight loss was recommended to help cut down the the amount of estrogen, also recommended progestin therapy especially the IUD.  In the meantime, will evaluate for other causes for AUB with labs and ultrasound.  Discussed management options; progestin oral therapy vs IUD,  Naproxen and Lysteda. She wants to avoid progestin oral therapy for now, will try Naproxen and Lysteda as directed. She is considering getting the progestin IUD. Depending on ultrasound findings and her risk factors, patient may need endometrial biopsy and she was  made aware of this. - CBC - TSH+Prl+TestT+TestF+17OHP - 03/09/2022 PELVIC COMPLETE WITH TRANSVAGINAL; Future - Hemoglobin A1c - naproxen (NAPROSYN) 500 MG tablet; Take 1 tablet (500 mg total) by mouth 2 (two) times daily with a meal.  Dispense: 30 tablet; Refill: 3 - tranexamic acid (LYSTEDA) 650 MG TABS tablet; Take 2 tablets (  1,300 mg total) by mouth 3 (three) times daily. Take during menses for a maximum of five days  Dispense: 30 tablet; Refill: 2 Bleeding precautions reviewed with patient.  Routine preventative health maintenance measures emphasized, up to date on pap  smear. Please refer to After Visit Summary for other counseling recommendations.   Return in about 1 month (around 02/13/2022) for Followup AUB, possible IUD insertion.    I spent 30 minutes dedicated to the care of this patient including pre-visit review of records, face to face time with the patient discussing her conditions and treatments and post visit orders.    Jaynie Collins, MD, FACOG Obstetrician & Gynecologist, [facility] for [employer], [facility]

## 2022-01-19 ENCOUNTER — Other Ambulatory Visit (HOSPITAL_COMMUNITY): Payer: BC Managed Care – PPO

## 2022-01-27 LAB — HEMOGLOBIN A1C
Est. average glucose Bld gHb Est-mCnc: 108 mg/dL
Hgb A1c MFr Bld: 5.4 % (ref 4.8–5.6)

## 2022-01-27 LAB — CBC
Hematocrit: 36.3 % (ref 34.0–46.6)
Hemoglobin: 12.1 g/dL (ref 11.1–15.9)
MCH: 30.9 pg (ref 26.6–33.0)
MCHC: 33.3 g/dL (ref 31.5–35.7)
MCV: 93 fL (ref 79–97)
Platelets: 320 10*3/uL (ref 150–450)
RBC: 3.91 x10E6/uL (ref 3.77–5.28)
RDW: 12.3 % (ref 11.7–15.4)
WBC: 9.9 10*3/uL (ref 3.4–10.8)

## 2022-01-27 LAB — TSH+PRL+TESTT+TESTF+17OHP
17-Hydroxyprogesterone: 12 ng/dL
Prolactin: 6.7 ng/mL (ref 4.8–23.3)
TSH: 1.96 u[IU]/mL (ref 0.450–4.500)
Testosterone, Free: 2.1 pg/mL (ref 0.0–4.2)
Testosterone, Total, LC/MS: 14.5 ng/dL (ref 10.0–55.0)

## 2022-01-31 DIAGNOSIS — Z03818 Encounter for observation for suspected exposure to other biological agents ruled out: Secondary | ICD-10-CM | POA: Diagnosis not present

## 2022-01-31 DIAGNOSIS — Z20822 Contact with and (suspected) exposure to covid-19: Secondary | ICD-10-CM | POA: Diagnosis not present

## 2022-02-15 ENCOUNTER — Ambulatory Visit: Payer: BC Managed Care – PPO | Admitting: Family Medicine

## 2022-02-20 DIAGNOSIS — F411 Generalized anxiety disorder: Secondary | ICD-10-CM | POA: Diagnosis not present

## 2022-02-20 DIAGNOSIS — F3131 Bipolar disorder, current episode depressed, mild: Secondary | ICD-10-CM | POA: Diagnosis not present

## 2022-03-27 DIAGNOSIS — F411 Generalized anxiety disorder: Secondary | ICD-10-CM | POA: Diagnosis not present

## 2022-03-27 DIAGNOSIS — F3131 Bipolar disorder, current episode depressed, mild: Secondary | ICD-10-CM | POA: Diagnosis not present

## 2022-04-27 DIAGNOSIS — F411 Generalized anxiety disorder: Secondary | ICD-10-CM | POA: Diagnosis not present

## 2022-04-27 DIAGNOSIS — F3131 Bipolar disorder, current episode depressed, mild: Secondary | ICD-10-CM | POA: Diagnosis not present

## 2022-10-02 ENCOUNTER — Encounter (HOSPITAL_COMMUNITY): Payer: Self-pay | Admitting: Emergency Medicine

## 2022-10-02 ENCOUNTER — Ambulatory Visit (INDEPENDENT_AMBULATORY_CARE_PROVIDER_SITE_OTHER): Payer: BC Managed Care – PPO

## 2022-10-02 ENCOUNTER — Ambulatory Visit (HOSPITAL_COMMUNITY)
Admission: EM | Admit: 2022-10-02 | Discharge: 2022-10-02 | Disposition: A | Discharge status: Home / Self Care | Payer: BC Managed Care – PPO | Attending: Emergency Medicine | Admitting: Emergency Medicine

## 2022-10-02 ENCOUNTER — Other Ambulatory Visit: Payer: Self-pay

## 2022-10-02 DIAGNOSIS — R062 Wheezing: Secondary | ICD-10-CM | POA: Diagnosis not present

## 2022-10-02 DIAGNOSIS — J988 Other specified respiratory disorders: Secondary | ICD-10-CM | POA: Diagnosis not present

## 2022-10-02 DIAGNOSIS — R0602 Shortness of breath: Secondary | ICD-10-CM

## 2022-10-02 DIAGNOSIS — R059 Cough, unspecified: Secondary | ICD-10-CM | POA: Diagnosis not present

## 2022-10-02 MED ORDER — ALBUTEROL SULFATE HFA 108 (90 BASE) MCG/ACT IN AERS: 2.0000 | INHALATION_SPRAY | Freq: Four times a day (QID) | RESPIRATORY_TRACT | 0 refills | Status: DC | PRN

## 2022-10-02 MED ORDER — AMOXICILLIN-POT CLAVULANATE 875-125 MG PO TABS: 1.0000 | ORAL_TABLET | Freq: Two times a day (BID) | ORAL | 0 refills | Status: DC

## 2022-10-02 MED ORDER — ALBUTEROL SULFATE (2.5 MG/3ML) 0.083% IN NEBU
2.5000 mg | INHALATION_SOLUTION | Freq: Once | RESPIRATORY_TRACT | Status: AC
  Administered 2022-10-02: 2.5 mg via RESPIRATORY_TRACT

## 2022-10-02 MED ORDER — METHYLPREDNISOLONE SODIUM SUCC 125 MG IJ SOLR
INTRAMUSCULAR | Status: AC
  Filled 2022-10-02: qty 2

## 2022-10-02 MED ORDER — PREDNISONE 10 MG PO TABS: ORAL_TABLET | ORAL | 0 refills | Status: DC

## 2022-10-02 MED ORDER — METHYLPREDNISOLONE SODIUM SUCC 125 MG IJ SOLR
80.0000 mg | Freq: Once | INTRAMUSCULAR | Status: AC
  Administered 2022-10-02: 1.28 mg via INTRAMUSCULAR

## 2022-10-02 MED ORDER — ALBUTEROL SULFATE (2.5 MG/3ML) 0.083% IN NEBU
INHALATION_SOLUTION | RESPIRATORY_TRACT | Status: AC
  Filled 2022-10-02: qty 3

## 2022-10-02 NOTE — ED Triage Notes (Signed)
Complains of cough, body aches, chest congestion and sob .  Patient reports a cough for one week, but not feeling bad.  Symptoms worsened over the week end.    Took dayquil product today.   Patient has not done a covid test

## 2022-10-02 NOTE — ED Provider Notes (Signed)
[facility]    CSN: [id number] Arrival date & time: 10/02/22  1002      History   Chief Complaint No chief complaint on file.   HPI Dorothy Pierce is a 36 y.o. female.  Patient presents due to productive cough x 1 week.  Patient states body aches, shortness of breath upon exertion, and chest congestion started 2 days ago.  Patient denies fever chills.  Patient denies any exposure to any known illnesses like COVID or the flu .  Patient denies having any COVID testing performed .Patient denies smoking currently.  Patient reports occasional marijuana use in the past that she quit 2 months ago.  Patient reports having pneumonia 2 years ago.  Patient denies any history of diabetes mellitus.  Patient denies any history of asthma.   HPI  Past Medical History:  Diagnosis Date   Bipolar 1 disorder (HCC)    HTN (hypertension)    Obesity (BMI 30.0-34.9)     Patient Active Problem List   Diagnosis Date Noted   Bipolar 1 disorder, depressed, mild (HCC) 12/19/2019   Panic attacks 11/04/2019   PTSD (post-traumatic stress disorder) 11/04/2019   Hypertension 12/15/2015   GERD (gastroesophageal reflux disease) 12/15/2015   Obesity, morbid, BMI 50 or higher (HCC) 09/30/2015    History reviewed. No pertinent surgical history.  OB History   No obstetric history on file.      Home Medications    Prior to Admission medications   Medication Sig Start Date End Date Taking? Authorizing Provider  albuterol (VENTOLIN HFA) 108 (90 Base) MCG/ACT inhaler Inhale 2 puffs into the lungs every 6 (six) hours as needed for wheezing or shortness of breath. 10/02/22   Debby Freiberg, NP  amoxicillin-clavulanate (AUGMENTIN) 875-125 MG tablet Take 1 tablet by mouth every 12 (twelve) hours. 10/02/22   Debby Freiberg, NP  predniSONE (DELTASONE) 10 MG tablet Take 6 tablets on the first day, 5 tablets on the second day, 4 tablets on the third day, 3 tablets on the fourth day, 2 tablets  on the fifth day, and 1 tablet on the last day. 10/02/22   Debby Freiberg, NP    Family History Family History  Problem Relation Age of Onset   Hypertension Mother    Depression Mother    Hyperlipidemia Mother    Thyroid disease Mother    Allergies Sister    Depression Sister    Asthma Sister    Hypertension Maternal Aunt    Hyperlipidemia Maternal Aunt    Arthritis Maternal Aunt    Cancer Maternal Aunt        skin/ breast   Hypertension Maternal Uncle    Hyperlipidemia Maternal Uncle    Hypertension Maternal Grandmother    Hyperlipidemia Maternal Grandmother    Asthma Maternal Grandmother    Diabetes Maternal Grandmother    Hypertension Maternal Grandfather    Hyperlipidemia Maternal Grandfather    Diabetes Maternal Grandfather     Social History Social History   Tobacco Use   Smoking status: Former   Smokeless tobacco: Never  Building services engineer Use: Never used  Substance Use Topics   Alcohol use: Yes    Alcohol/week: 0.0 standard drinks of alcohol    Comment: rarely. history of addiction   Drug use: Yes    Types: Marijuana    Comment: former marijuana     Allergies   Patient has no known allergies.   Review of Systems Review of Systems  Constitutional:  Positive for activity change and fatigue. Negative for appetite change, chills, diaphoresis and fever.  HENT:  Negative for congestion, ear discharge, ear pain, postnasal drip, rhinorrhea, sinus pressure, sinus pain, sore throat and trouble swallowing.   Respiratory:  Positive for cough, chest tightness and shortness of breath. Negative for apnea and choking.        Chest Congestion   Cardiovascular:  Negative for chest pain, palpitations and leg swelling.  Gastrointestinal: Negative.  Negative for abdominal pain, diarrhea, nausea and vomiting.  Musculoskeletal:  Positive for myalgias.     Physical Exam Triage Vital Signs ED Triage Vitals  Enc Vitals Group     BP 10/02/22 1121 132/82      Pulse Rate 10/02/22 1121 (!) 113     Resp 10/02/22 1121 (!) 24     Temp 10/02/22 1121 98.3 F (36.8 C)     Temp Source 10/02/22 1121 Oral     SpO2 10/02/22 1121 95 %     Weight --      Height --      Head Circumference --      Peak Flow --      Pain Score 10/02/22 1116 5     Pain Loc --      Pain Edu? --      Excl. in GC? --    No data found.  Updated Vital Signs BP 132/82 (BP Location: Left Arm)   Pulse (!) 113   Temp 98.3 F (36.8 C) (Oral)   Resp (!) 24   SpO2 95%   Physical Exam Vitals and nursing note reviewed.  HENT:     Nose: Nose normal.     Mouth/Throat:     Lips: Pink.     Mouth: Mucous membranes are moist.  Cardiovascular:     Rate and Rhythm: Regular rhythm. Tachycardia present.     Heart sounds: Normal heart sounds, S1 normal and S2 normal.  Pulmonary:     Effort: Tachypnea present.     Breath sounds: No stridor. Examination of the right-upper field reveals wheezing. Examination of the left-upper field reveals wheezing. Examination of the right-middle field reveals wheezing and rhonchi. Examination of the left-middle field reveals wheezing and rhonchi. Examination of the right-lower field reveals wheezing and rhonchi. Examination of the left-lower field reveals wheezing and rhonchi. Wheezing and rhonchi present. No rales.  Lymphadenopathy:     Cervical: No cervical adenopathy.  Neurological:     Mental Status: She is alert.      UC Treatments / Results  Labs (all labs ordered are listed, but only abnormal results are displayed) Labs Reviewed - No data to display  EKG   Radiology DG Chest 2 View  Result Date: 10/02/2022 CLINICAL DATA:  Cough, shortness of breath EXAM: CHEST - 2 VIEW COMPARISON:  12/17/2019 FINDINGS: Cardiac size is within normal limits. There are no signs of pulmonary edema. There is prominence of interstitial markings in both lungs. There is peribronchial thickening. There is no focal pulmonary consolidation. There is no pleural  effusion or pneumothorax. IMPRESSION: Peribronchial thickening suggests bronchitis. Subtle increased interstitial markings are seen in both lungs, more so in the lower lung fields suggesting possible interstitial pneumonia. There is no pleural effusion. Electronically Signed   By: Ernie Avena M.D.   On: 10/02/2022 12:31    Procedures Procedures (including critical care time)  Medications Ordered in UC Medications  albuterol (PROVENTIL) (2.5 MG/3ML) 0.083% nebulizer solution 2.5 mg (2.5 mg Nebulization Given 10/02/22  1231)  methylPREDNISolone sodium succinate (SOLU-MEDROL) 125 mg/2 mL injection 80 mg (1.28 mg Intramuscular Given 10/02/22 1245)    Initial Impression / Assessment and Plan / UC Course  I have reviewed the triage vital signs and the nursing notes.  Pertinent labs & imaging results that were available during my care of the patient were reviewed by me and considered in my medical decision making (see chart for details).     Patient was evaluated for respiratory infection and wheezing .Chest x-ray performed in office showed possible interstitial pneumonia and peribronchial thickening.  Breathing treatment and Solu-Medrol IM given.  Patient experienced relief after breathing treatment.  Due to symptomology and chest x-ray patient was given medications to treat development of pneumonia and medication with anti-inflammatory properties. Patient was sent home with Augmentin, prednisone, and albuterol inhaler.  Patient was made aware to start prednisone prescription tomorrow due to receiving a steroid injection in office. Patient was made aware on how to take these medications and possible side effects.  Patient was made aware of red flag symptoms that would warrant an emergency department visit.  Patient verbalized understanding of instructions.  Final Clinical Impressions(s) / UC Diagnoses   Final diagnoses:  Respiratory infection  Wheezing     Discharge Instructions       Augmentin has been sent to the pharmacy, take this medication 2 times daily for the next 7 days.  This medication can cause stomach upset, advised that you have food on your stomach when taking this medication.  Prednisone has been sent to the pharmacy, instructions are listed on the prescription, this is a prednisone taper.  Albuterol was sent to the pharmacy, 2 puffs every 6 hours for the next 4 days.  After 4 days you can use it as needed every 6 hours.   As discussed, begin having worsening shortness of breath, chest pain, lightheadedness, dizziness or any new/concerning symptoms please go to the nearest emergency department.      ED Prescriptions     Medication Sig Dispense Auth. Provider   amoxicillin-clavulanate (AUGMENTIN) 875-125 MG tablet  (Status: Discontinued) Take 1 tablet by mouth every 12 (twelve) hours. 14 tablet Flossie Dibble, NP   predniSONE (DELTASONE) 10 MG tablet  (Status: Discontinued) Take 6 tablets on the first day, 5 tablets on the second day, 4 tablets on the third day, 3 tablets on the fourth day, 2 tablets on the fifth day, and 1 tablet on the last day. 21 tablet Flossie Dibble, NP   albuterol (VENTOLIN HFA) 108 (90 Base) MCG/ACT inhaler  (Status: Discontinued) Inhale 2 puffs into the lungs every 6 (six) hours as needed for wheezing or shortness of breath. 8 g Flossie Dibble, NP   albuterol (VENTOLIN HFA) 108 (90 Base) MCG/ACT inhaler Inhale 2 puffs into the lungs every 6 (six) hours as needed for wheezing or shortness of breath. 8 g Flossie Dibble, NP   amoxicillin-clavulanate (AUGMENTIN) 875-125 MG tablet Take 1 tablet by mouth every 12 (twelve) hours. 14 tablet Flossie Dibble, NP   predniSONE (DELTASONE) 10 MG tablet Take 6 tablets on the first day, 5 tablets on the second day, 4 tablets on the third day, 3 tablets on the fourth day, 2 tablets on the fifth day, and 1 tablet on the last day. 21 tablet Flossie Dibble, NP      PDMP not  reviewed this encounter.   Flossie Dibble, NP 10/02/22 1430

## 2022-10-02 NOTE — Discharge Instructions (Addendum)
Augmentin has been sent to the pharmacy, take this medication 2 times daily for the next 7 days.  This medication can cause stomach upset, advised that you have food on your stomach when taking this medication.  Prednisone has been sent to the pharmacy, instructions are listed on the prescription, this is a prednisone taper.  Albuterol was sent to the pharmacy, 2 puffs every 6 hours for the next 4 days.  After 4 days you can use it as needed every 6 hours.   As discussed, begin having worsening shortness of breath, chest pain, lightheadedness, dizziness or any new/concerning symptoms please go to the nearest emergency department.

## 2023-04-25 DIAGNOSIS — F3132 Bipolar disorder, current episode depressed, moderate: Secondary | ICD-10-CM | POA: Diagnosis not present

## 2023-04-26 ENCOUNTER — Encounter: Payer: Self-pay | Admitting: Family Medicine

## 2023-04-26 ENCOUNTER — Ambulatory Visit (INDEPENDENT_AMBULATORY_CARE_PROVIDER_SITE_OTHER): Payer: BC Managed Care – PPO

## 2023-04-26 ENCOUNTER — Ambulatory Visit (INDEPENDENT_AMBULATORY_CARE_PROVIDER_SITE_OTHER): Payer: BC Managed Care – PPO | Admitting: Family Medicine

## 2023-04-26 VITALS — BP 129/81 | HR 101 | Temp 97.8°F | Resp 18 | Ht 65.0 in | Wt >= 6400 oz

## 2023-04-26 DIAGNOSIS — Z6841 Body Mass Index (BMI) 40.0 and over, adult: Secondary | ICD-10-CM

## 2023-04-26 DIAGNOSIS — Z7689 Persons encountering health services in other specified circumstances: Secondary | ICD-10-CM | POA: Diagnosis not present

## 2023-04-26 DIAGNOSIS — R062 Wheezing: Secondary | ICD-10-CM

## 2023-04-26 DIAGNOSIS — R0683 Snoring: Secondary | ICD-10-CM

## 2023-04-26 MED ORDER — METHYLPREDNISOLONE 4 MG PO TBPK: ORAL_TABLET | ORAL | 0 refills | Status: DC

## 2023-04-26 MED ORDER — ALBUTEROL SULFATE HFA 108 (90 BASE) MCG/ACT IN AERS: 2.0000 | INHALATION_SPRAY | Freq: Four times a day (QID) | RESPIRATORY_TRACT | 0 refills | Status: DC | PRN

## 2023-04-26 MED ORDER — MONTELUKAST SODIUM 10 MG PO TABS: 10.0000 mg | ORAL_TABLET | Freq: Every day | ORAL | 3 refills | Status: DC

## 2023-04-26 NOTE — Progress Notes (Signed)
New Patient Office Visit  Subjective    Patient ID: Dorothy Pierce, female    DOB: 1986/06/11  Age: 37 y.o. MRN: [id number]  CC:  Chief Complaint  Patient presents with   Establish Care    The only concern patient has today is Shortness of breath , she says she's had it for a few days now but she states that it occurs while going to bed and  again when she wakes up    HPI Dorothy Pierce presents to establish care. Pt is new to me.  Pt reports a hx of pneumonia and bronchitis last year. She reports she has had wheezing since then and now. She reports she use to have to use an inhaler during the fall months when she was in high school. She reports her episodes were ok outside of the fall months. She also took Allegra D for a while due to eye symptoms and sneezing. She has tried Zyrtec  last month due to sneezing.  She reports a cough later in the day. She reports the wheezing comes on when she's about to go to sleep. She reports she wakes up during the night due to being restless or she 'can't breathe'. This has been present for the last few months. She was told that she snores. She reports it being hard to breathe and feels SOB in her chest. No pets at home.  Pt does reports a lot of weight in the last few years. Before the pandemic, she would do intermittent fasting and working out. She was doing cross fit and threw her back out. This lessened some with working out. Pt does report she was vaping THC but stopped a few weeks ago. She felt like this was making her lung issue worse.   Outpatient Encounter Medications as of 04/26/2023  Medication Sig   methylPREDNISolone (MEDROL DOSEPAK) 4 MG TBPK tablet 6-day pack as directed   montelukast (SINGULAIR) 10 MG tablet Take 1 tablet (10 mg total) by mouth at bedtime.   [DISCONTINUED] albuterol (VENTOLIN HFA) 108 (90 Base) MCG/ACT inhaler Inhale 2 puffs into the lungs every 6 (six) hours as needed for wheezing or shortness of breath.   albuterol  (VENTOLIN HFA) 108 (90 Base) MCG/ACT inhaler Inhale 2 puffs into the lungs every 6 (six) hours as needed for wheezing or shortness of breath.   [DISCONTINUED] amoxicillin-clavulanate (AUGMENTIN) 875-125 MG tablet Take 1 tablet by mouth every 12 (twelve) hours.   [DISCONTINUED] predniSONE (DELTASONE) 10 MG tablet Take 6 tablets on the first day, 5 tablets on the second day, 4 tablets on the third day, 3 tablets on the fourth day, 2 tablets on the fifth day, and 1 tablet on the last day.   No facility-administered encounter medications on file as of 04/26/2023.    Past Medical History:  Diagnosis Date   Bipolar 1 disorder (HCC)    Obesity (BMI 30.0-34.9)     History reviewed. No pertinent surgical history.  Family History  Problem Relation Age of Onset   Hypertension Mother    Depression Mother    Hyperlipidemia Mother    Thyroid disease Mother    Allergies Sister    Depression Sister    Asthma Sister    Hypertension Maternal Aunt    Hyperlipidemia Maternal Aunt    Arthritis Maternal Aunt    Cancer Maternal Aunt        skin/ breast   Hypertension Maternal Uncle    Hyperlipidemia Maternal Uncle  Hypertension Maternal Grandmother    Hyperlipidemia Maternal Grandmother    Asthma Maternal Grandmother    Diabetes Maternal Grandmother    Hypertension Maternal Grandfather    Hyperlipidemia Maternal Grandfather    Diabetes Maternal Grandfather     Social History   Socioeconomic History   Marital status: Single    Spouse name: Not on file   Number of children: Not on file   Years of education: Not on file   Highest education level: Not on file  Occupational History   Not on file  Tobacco Use   Smoking status: Former    Passive exposure: Never   Smokeless tobacco: Never  Vaping Use   Vaping Use: Some days  Substance and Sexual Activity   Alcohol use: Yes    Alcohol/week: 0.0 standard drinks of alcohol    Comment: rarely. history of addiction   Drug use: Yes    Types:  Marijuana    Comment: former marijuana   Sexual activity: Yes    Partners: Female, Female    Birth control/protection: None    Comment: single right now  Other Topics Concern   Not on file  Social History Narrative   Not on file   Social Determinants of Health   Financial Resource Strain: Not on file  Food Insecurity: Not on file  Transportation Needs: Not on file  Physical Activity: Not on file  Stress: Not on file  Social Connections: Not on file  Intimate Partner Violence: Not on file    Review of Systems  Respiratory:  Positive for shortness of breath and wheezing. Negative for cough, hemoptysis and sputum production.   Psychiatric/Behavioral:         Snoring and sleep disturbances  All other systems reviewed and are negative.       Objective    BP 129/81   Pulse (!) 101   Temp 97.8 F (36.6 C) (Oral)   Resp 18   Ht 5\' 5"  (1.651 m)   Wt (!) 420 lb 9.6 oz (190.8 kg)   SpO2 96%   BMI 69.99 kg/m   Physical Exam Vitals and nursing note reviewed.  Constitutional:      Appearance: She is obese.  HENT:     Head: Normocephalic and atraumatic.     Right Ear: External ear normal.     Left Ear: External ear normal.     Nose: Nose normal.     Mouth/Throat:     Mouth: Mucous membranes are moist.  Eyes:     Pupils: Pupils are equal, round, and reactive to light.  Cardiovascular:     Rate and Rhythm: Normal rate and regular rhythm.     Pulses: Normal pulses.     Heart sounds: Normal heart sounds.  Pulmonary:     Effort: Pulmonary effort is normal. No respiratory distress.     Breath sounds: Wheezing present.  Abdominal:     General: Abdomen is flat. Bowel sounds are normal.  Skin:    General: Skin is warm.     Capillary Refill: Capillary refill takes less than 2 seconds.  Neurological:     General: No focal deficit present.     Mental Status: She is alert and oriented to person, place, and time. Mental status is at baseline.  Psychiatric:        Mood and  Affect: Mood normal.        Behavior: Behavior normal.        Thought Content: Thought content normal.  Judgment: Judgment normal.        Assessment & Plan:   Problem List Items Addressed This Visit   None Visit Diagnoses     Encounter to establish care with new doctor    -  Primary   Wheezing       Relevant Medications   montelukast (SINGULAIR) 10 MG tablet   methylPREDNISolone (MEDROL DOSEPAK) 4 MG TBPK tablet   albuterol (VENTOLIN HFA) 108 (90 Base) MCG/ACT inhaler   Other Relevant Orders   DG Chest 2 View   Snoring       Relevant Orders   Home sleep test   BMI 70 and over, adult [facility])       Relevant Orders   Home sleep test     Encounter to establish care with new doctor  Wheezing -     DG Chest 2 View; Future -     Montelukast Sodium; Take 1 tablet (10 mg total) by mouth at bedtime.  Dispense: 30 tablet; Refill: 3 -     methylPREDNISolone; 6-day pack as directed  Dispense: 21 tablet; Refill: 0 -     Albuterol Sulfate HFA; Inhale 2 puffs into the lungs every 6 (six) hours as needed for wheezing or shortness of breath.  Dispense: 8 g; Refill: 0  Snoring -     Home sleep test  BMI 70 and over, adult [facility]) -     Home sleep test   Send for xray Advised to stop vaping Singulair at night along with medrol dose pack and refilled albuterol inhaler to use prn for SOB and wheezing. Suspect reactive airway disease from seasonal allergies, vaping etc. Also suspect undiagnosed sleep apnea. Send home sleep study Exercise and diet counseling today.   Return in about 4 weeks (around 05/24/2023) for Annual Physical.   Suzan Slick, MD

## 2023-05-02 ENCOUNTER — Other Ambulatory Visit: Payer: Self-pay | Admitting: Family Medicine

## 2023-05-02 DIAGNOSIS — R062 Wheezing: Secondary | ICD-10-CM

## 2023-05-02 NOTE — Telephone Encounter (Signed)
Changed albuterol to requested inhaler Levalbuterol

## 2023-05-03 DIAGNOSIS — F3132 Bipolar disorder, current episode depressed, moderate: Secondary | ICD-10-CM | POA: Diagnosis not present

## 2023-05-03 NOTE — Telephone Encounter (Signed)
Prior Authorization (mentioned by patient in [facility] admin queue) for new prescription received and faxed over to [facility] for PA through [facility]. Katha Hamming

## 2023-05-04 MED ORDER — LEVALBUTEROL TARTRATE 45 MCG/ACT IN AERO: 2.0000 | INHALATION_SPRAY | RESPIRATORY_TRACT | 12 refills | Status: AC | PRN

## 2023-05-04 NOTE — Addendum Note (Signed)
Addended by: Suzan Slick on: 05/04/2023 08:05 AM   Modules accepted: Orders

## 2023-05-10 DIAGNOSIS — F3132 Bipolar disorder, current episode depressed, moderate: Secondary | ICD-10-CM | POA: Diagnosis not present

## 2023-05-17 DIAGNOSIS — F3132 Bipolar disorder, current episode depressed, moderate: Secondary | ICD-10-CM | POA: Diagnosis not present

## 2023-05-25 ENCOUNTER — Ambulatory Visit (INDEPENDENT_AMBULATORY_CARE_PROVIDER_SITE_OTHER): Payer: BC Managed Care – PPO | Admitting: Family Medicine

## 2023-05-25 ENCOUNTER — Encounter: Payer: Self-pay | Admitting: Family Medicine

## 2023-05-25 VITALS — BP 117/71 | HR 77 | Temp 97.5°F | Resp 20 | Ht 65.0 in | Wt >= 6400 oz

## 2023-05-25 DIAGNOSIS — E559 Vitamin D deficiency, unspecified: Secondary | ICD-10-CM

## 2023-05-25 DIAGNOSIS — Z13220 Encounter for screening for lipoid disorders: Secondary | ICD-10-CM

## 2023-05-25 DIAGNOSIS — Z0000 Encounter for general adult medical examination without abnormal findings: Secondary | ICD-10-CM

## 2023-05-25 DIAGNOSIS — R7302 Impaired glucose tolerance (oral): Secondary | ICD-10-CM | POA: Diagnosis not present

## 2023-05-25 DIAGNOSIS — Z136 Encounter for screening for cardiovascular disorders: Secondary | ICD-10-CM | POA: Diagnosis not present

## 2023-05-25 DIAGNOSIS — Z1159 Encounter for screening for other viral diseases: Secondary | ICD-10-CM | POA: Diagnosis not present

## 2023-05-25 DIAGNOSIS — Z1329 Encounter for screening for other suspected endocrine disorder: Secondary | ICD-10-CM

## 2023-05-25 NOTE — Progress Notes (Signed)
Complete physical exam  Patient: Dorothy Pierce   DOB: 1986-09-01   36 y.o. Female  MRN: [id number]  Subjective:    Chief Complaint  Patient presents with   Annual Exam    Patient is here for her annual physical, patient states that sh does not have any issues today to discuss    Dorothy Pierce is a 37 y.o. female who presents today for a complete physical exam. She reports consuming a  restricting carbohydrates  diet.  walking  She generally feels well. She reports sleeping well. She does not have additional problems to discuss today.  Pt reports she has hx of vitamin D and would like this checked also today with labs. She has been taking OTC vitamin D supplements.   Most recent fall risk assessment:    04/26/2023    9:58 AM  Fall Risk   Falls in the past year? 0  Number falls in past yr: 0  Injury with Fall? 0  Risk for fall due to : No Fall Risks  Follow up Falls evaluation completed     Most recent depression screenings:    05/25/2023    9:33 AM 04/26/2023    9:58 AM  PHQ 2/9 Scores  PHQ - 2 Score 0 6  PHQ- 9 Score 4 23    Vision:Within last year  Patient Active Problem List   Diagnosis Date Noted   Bipolar 1 disorder, depressed, mild (HCC) 12/19/2019   Panic attacks 11/04/2019   PTSD (post-traumatic stress disorder) 11/04/2019   GERD (gastroesophageal reflux disease) 12/15/2015   Obesity, morbid, BMI 50 or higher (HCC) 09/30/2015   Past Medical History:  Diagnosis Date   Bipolar 1 disorder (HCC)    Obesity (BMI 30.0-34.9)    History reviewed. No pertinent surgical history. Social History   Tobacco Use   Smoking status: Former    Passive exposure: Never   Smokeless tobacco: Never  Vaping Use   Vaping Use: Some days  Substance Use Topics   Alcohol use: Yes    Alcohol/week: 0.0 standard drinks of alcohol    Comment: rarely. history of addiction   Drug use: Yes    Types: Marijuana    Comment: former marijuana   Family Status  Relation Name  Status   Mother  Alive   Sister  (Not Specified)   Mat Aunt  (Not Specified)   Mat Uncle  (Not Specified)   MGM  Deceased   MGF  (Not Specified)   No Known Allergies    Patient Care Team: Suzan Slick, MD as PCP - General (Family Medicine)   Outpatient Medications Prior to Visit  Medication Sig   ibuprofen (ADVIL) 800 MG tablet Take 800 mg by mouth every 8 (eight) hours as needed.   levalbuterol (XOPENEX HFA) 45 MCG/ACT inhaler Inhale 2 puffs into the lungs every 4 (four) hours as needed for wheezing.   methylPREDNISolone (MEDROL DOSEPAK) 4 MG TBPK tablet 6-day pack as directed   montelukast (SINGULAIR) 10 MG tablet Take 1 tablet (10 mg total) by mouth at bedtime.   No facility-administered medications prior to visit.    Review of Systems  All other systems reviewed and are negative.         Objective:     BP 117/71   Pulse 77   Temp (!) 97.5 F (36.4 C) (Oral)   Resp 20   Ht 5\' 5"  (1.651 m)   Wt (!) 426 lb 8 oz (193.5 kg)  LMP  (LMP Unknown)   SpO2 96%   BMI 70.97 kg/m  BP Readings from Last 3 Encounters:  05/25/23 117/71  04/26/23 129/81  10/02/22 132/82      Physical Exam Vitals and nursing note reviewed.  Constitutional:      Appearance: Normal appearance. She is obese.  HENT:     Head: Normocephalic and atraumatic.     Right Ear: Tympanic membrane, ear canal and external ear normal.     Left Ear: Tympanic membrane, ear canal and external ear normal.     Nose: Nose normal.     Mouth/Throat:     Mouth: Mucous membranes are moist.     Pharynx: Oropharynx is clear.  Eyes:     Conjunctiva/sclera: Conjunctivae normal.     Pupils: Pupils are equal, round, and reactive to light.  Cardiovascular:     Rate and Rhythm: Normal rate and regular rhythm.     Pulses: Normal pulses.     Heart sounds: Normal heart sounds.  Pulmonary:     Effort: Pulmonary effort is normal.     Breath sounds: Normal breath sounds.  Abdominal:     General: Abdomen is  flat. Bowel sounds are normal.  Musculoskeletal:     Cervical back: Normal range of motion.  Skin:    General: Skin is warm.     Capillary Refill: Capillary refill takes less than 2 seconds.  Neurological:     General: No focal deficit present.     Mental Status: She is alert and oriented to person, place, and time. Mental status is at baseline.  Psychiatric:        Mood and Affect: Mood normal.        Behavior: Behavior normal.        Thought Content: Thought content normal.        Judgment: Judgment normal.      No results found for any visits on 05/25/23.      Assessment & Plan:    Routine Health Maintenance and Physical Exam  Immunization History  Administered Date(s) Administered   Tdap 06/28/2020    Health Maintenance  Topic Date Due   COVID-19 Vaccine (1) Never done   Hepatitis C Screening  Never done   INFLUENZA VACCINE  07/05/2023   PAP-Cervical Cytology Screening  08/17/2023   PAP SMEAR-Modifier  08/17/2023   DTaP/Tdap/Td (2 - Td or Tdap) 06/28/2030   HIV Screening  Completed   HPV VACCINES  Aged Out    Discussed health benefits of physical activity, and encouraged her to engage in regular exercise appropriate for her age and condition.  Problem List Items Addressed This Visit   None Visit Diagnoses     Annual physical exam    -  Primary   Encounter for lipid screening for cardiovascular disease       Relevant Orders   Lipid panel   Impaired glucose tolerance       Relevant Orders   CBC with Differential/Platelet   Comprehensive metabolic panel   Hemoglobin A1c   Screening for thyroid disorder       Relevant Orders   TSH   T4, free   Need for hepatitis C screening test       Relevant Orders   Hepatitis C antibody   Vitamin D deficiency       Relevant Orders   VITAMIN D 25 Hydroxy (Vit-D Deficiency, Fractures)      Return in about 1 year (around 05/24/2024) for Annual  Physical. Screening labs including Vitamin D (per pt's request)  Follow  up on labs See in 1 year sooner prn.    Suzan Slick, MD

## 2023-05-26 LAB — LIPID PANEL
Chol/HDL Ratio: 3.2 ratio (ref 0.0–4.4)
Cholesterol, Total: 144 mg/dL (ref 100–199)
HDL: 45 mg/dL (ref >39)
LDL Chol Calc (NIH): 87 mg/dL (ref 0–99)
Triglycerides: 60 mg/dL (ref 0–149)
VLDL Cholesterol Cal: 12 mg/dL (ref 5–40)

## 2023-05-26 LAB — T4, FREE: Free T4: 1.04 ng/dL (ref 0.82–1.77)

## 2023-05-26 LAB — COMPREHENSIVE METABOLIC PANEL
ALT: 19 IU/L (ref 0–32)
AST: 17 IU/L (ref 0–40)
Albumin: 3.7 g/dL — ABNORMAL LOW (ref 3.9–4.9)
Alkaline Phosphatase: 73 IU/L (ref 44–121)
BUN/Creatinine Ratio: 19 (ref 9–23)
BUN: 16 mg/dL (ref 6–20)
Bilirubin Total: 0.2 mg/dL (ref 0.0–1.2)
CO2: 19 mmol/L — ABNORMAL LOW (ref 20–29)
Calcium: 9.1 mg/dL (ref 8.7–10.2)
Chloride: 106 mmol/L (ref 96–106)
Creatinine, Ser: 0.84 mg/dL (ref 0.57–1.00)
Globulin, Total: 3.1 g/dL (ref 1.5–4.5)
Glucose: 91 mg/dL (ref 70–99)
Potassium: 4.3 mmol/L (ref 3.5–5.2)
Sodium: 140 mmol/L (ref 134–144)
Total Protein: 6.8 g/dL (ref 6.0–8.5)
eGFR: 93 mL/min/{1.73_m2} (ref >59)

## 2023-05-26 LAB — CBC WITH DIFFERENTIAL/PLATELET
Basophils Absolute: 0 10*3/uL (ref 0.0–0.2)
Basos: 0 %
EOS (ABSOLUTE): 0.1 10*3/uL (ref 0.0–0.4)
Eos: 2 %
Hematocrit: 36.3 % (ref 34.0–46.6)
Hemoglobin: 11.6 g/dL (ref 11.1–15.9)
Immature Grans (Abs): 0 10*3/uL (ref 0.0–0.1)
Immature Granulocytes: 0 %
Lymphocytes Absolute: 2.2 10*3/uL (ref 0.7–3.1)
Lymphs: 28 %
MCH: 30.8 pg (ref 26.6–33.0)
MCHC: 32 g/dL (ref 31.5–35.7)
MCV: 96 fL (ref 79–97)
Monocytes Absolute: 0.4 10*3/uL (ref 0.1–0.9)
Monocytes: 5 %
Neutrophils Absolute: 5.1 10*3/uL (ref 1.4–7.0)
Neutrophils: 65 %
Platelets: 295 10*3/uL (ref 150–450)
RBC: 3.77 x10E6/uL (ref 3.77–5.28)
RDW: 13.2 % (ref 11.7–15.4)
WBC: 7.9 10*3/uL (ref 3.4–10.8)

## 2023-05-26 LAB — VITAMIN D 25 HYDROXY (VIT D DEFICIENCY, FRACTURES): Vit D, 25-Hydroxy: 21.7 ng/mL — ABNORMAL LOW (ref 30.0–100.0)

## 2023-05-26 LAB — TSH: TSH: 3.35 u[IU]/mL (ref 0.450–4.500)

## 2023-05-26 LAB — HEMOGLOBIN A1C
Est. average glucose Bld gHb Est-mCnc: 114 mg/dL
Hgb A1c MFr Bld: 5.6 % (ref 4.8–5.6)

## 2023-05-26 LAB — HEPATITIS C ANTIBODY: Hep C Virus Ab: NONREACTIVE

## 2023-05-28 ENCOUNTER — Other Ambulatory Visit: Payer: Self-pay | Admitting: Family Medicine

## 2023-05-28 DIAGNOSIS — E559 Vitamin D deficiency, unspecified: Secondary | ICD-10-CM

## 2023-05-28 DIAGNOSIS — R062 Wheezing: Secondary | ICD-10-CM

## 2023-05-28 MED ORDER — MONTELUKAST SODIUM 10 MG PO TABS: 10.0000 mg | ORAL_TABLET | Freq: Every day | ORAL | 3 refills | Status: DC

## 2023-05-28 MED ORDER — VITAMIN D (ERGOCALCIFEROL) 1.25 MG (50000 UNIT) PO CAPS: 50000.0000 [IU] | ORAL_CAPSULE | ORAL | 0 refills | Status: DC

## 2023-05-31 DIAGNOSIS — F3132 Bipolar disorder, current episode depressed, moderate: Secondary | ICD-10-CM | POA: Diagnosis not present

## 2023-06-28 DIAGNOSIS — F3132 Bipolar disorder, current episode depressed, moderate: Secondary | ICD-10-CM | POA: Diagnosis not present

## 2023-07-18 DIAGNOSIS — F3132 Bipolar disorder, current episode depressed, moderate: Secondary | ICD-10-CM | POA: Diagnosis not present

## 2023-07-26 DIAGNOSIS — F3132 Bipolar disorder, current episode depressed, moderate: Secondary | ICD-10-CM | POA: Diagnosis not present

## 2023-08-09 DIAGNOSIS — F3132 Bipolar disorder, current episode depressed, moderate: Secondary | ICD-10-CM | POA: Diagnosis not present

## 2023-08-21 DIAGNOSIS — F3132 Bipolar disorder, current episode depressed, moderate: Secondary | ICD-10-CM | POA: Diagnosis not present

## 2023-09-06 DIAGNOSIS — F3132 Bipolar disorder, current episode depressed, moderate: Secondary | ICD-10-CM | POA: Diagnosis not present

## 2023-09-20 DIAGNOSIS — F3132 Bipolar disorder, current episode depressed, moderate: Secondary | ICD-10-CM | POA: Diagnosis not present

## 2023-10-18 DIAGNOSIS — F411 Generalized anxiety disorder: Secondary | ICD-10-CM | POA: Diagnosis not present

## 2023-10-18 DIAGNOSIS — F3132 Bipolar disorder, current episode depressed, moderate: Secondary | ICD-10-CM | POA: Diagnosis not present

## 2023-10-30 DIAGNOSIS — F3132 Bipolar disorder, current episode depressed, moderate: Secondary | ICD-10-CM | POA: Diagnosis not present

## 2023-10-30 DIAGNOSIS — F411 Generalized anxiety disorder: Secondary | ICD-10-CM | POA: Diagnosis not present

## 2023-11-16 DIAGNOSIS — F411 Generalized anxiety disorder: Secondary | ICD-10-CM | POA: Diagnosis not present

## 2023-11-16 DIAGNOSIS — F3132 Bipolar disorder, current episode depressed, moderate: Secondary | ICD-10-CM | POA: Diagnosis not present

## 2023-11-21 ENCOUNTER — Other Ambulatory Visit: Payer: Self-pay | Admitting: Family Medicine

## 2023-11-21 DIAGNOSIS — E559 Vitamin D deficiency, unspecified: Secondary | ICD-10-CM

## 2023-11-21 DIAGNOSIS — R062 Wheezing: Secondary | ICD-10-CM

## 2023-11-23 ENCOUNTER — Telehealth: Payer: BC Managed Care – PPO | Admitting: Physician Assistant

## 2023-11-23 DIAGNOSIS — J4531 Mild persistent asthma with (acute) exacerbation: Secondary | ICD-10-CM

## 2023-11-23 DIAGNOSIS — J208 Acute bronchitis due to other specified organisms: Secondary | ICD-10-CM

## 2023-11-23 DIAGNOSIS — B9689 Other specified bacterial agents as the cause of diseases classified elsewhere: Secondary | ICD-10-CM

## 2023-11-23 MED ORDER — AZITHROMYCIN 250 MG PO TABS: ORAL_TABLET | ORAL | 0 refills | Status: AC

## 2023-11-23 MED ORDER — VITAMIN D (ERGOCALCIFEROL) 1.25 MG (50000 UNIT) PO CAPS: 50000.0000 [IU] | ORAL_CAPSULE | ORAL | 0 refills | Status: AC

## 2023-11-23 MED ORDER — MONTELUKAST SODIUM 10 MG PO TABS: 10.0000 mg | ORAL_TABLET | Freq: Every day | ORAL | 3 refills | Status: AC

## 2023-11-23 MED ORDER — PROMETHAZINE-DM 6.25-15 MG/5ML PO SYRP: 5.0000 mL | ORAL_SOLUTION | Freq: Four times a day (QID) | ORAL | 0 refills | Status: AC | PRN

## 2023-11-23 MED ORDER — PREDNISONE 10 MG PO TABS: ORAL_TABLET | ORAL | 0 refills | Status: DC

## 2023-11-23 NOTE — Progress Notes (Signed)
Virtual Visit Consent   Dorothy Pierce, you are scheduled for a virtual visit with a [facility] provider today. Just as with appointments in the office, your consent must be obtained to participate. Your consent will be active for this visit and any virtual visit you may have with one of our providers in the next 365 days. If you have a MyChart account, a copy of this consent can be sent to you electronically.  As this is a virtual visit, video technology does not allow for your provider to perform a traditional examination. This may limit your provider's ability to fully assess your condition. If your provider identifies any concerns that need to be evaluated in person or the need to arrange testing (such as labs, EKG, etc.), we will make arrangements to do so. Although advances in technology are sophisticated, we cannot ensure that it will always work on either your end or our end. If the connection with a video visit is poor, the visit may have to be switched to a telephone visit. With either a video or telephone visit, we are not always able to ensure that we have a secure connection.  By engaging in this virtual visit, you consent to the provision of healthcare and authorize for your insurance to be billed (if applicable) for the services provided during this visit. Depending on your insurance coverage, you may receive a charge related to this service.  I need to obtain your verbal consent now. Are you willing to proceed with your visit today? Dorothy Pierce has provided verbal consent on 11/23/2023 for a virtual visit (video or telephone). Dorothy Loveless, PA-C  Date: 11/23/2023 6:21 PM  Virtual Visit via Video Note   I, Dorothy Pierce, connected with  Dorothy Pierce  ([id number], 11/12/1986) on 11/23/23 at  6:15 PM EST by a video-enabled telemedicine application and verified that I am speaking with the correct person using two identifiers.  Location: Patient: Virtual Visit  Location Patient: Home Provider: Virtual Visit Location Provider: Home Office   I discussed the limitations of evaluation and management by telemedicine and the availability of in person appointments. The patient expressed understanding and agreed to proceed.    History of Present Illness: Dorothy Pierce is a 37 y.o. who identifies as a female who was assigned female at birth, and is being seen today for cough and congestion.  HPI: Cough This is a new problem. The current episode started 1 to 4 weeks ago (2-3 weeks). The problem has been gradually worsening. The cough is Non-productive. Associated symptoms include chest pain (tightness), nasal congestion (did have initally but improvement), postnasal drip (mild) and shortness of breath (early morning with phlegm and at nighttime when she lies down). Pertinent negatives include no chills, ear congestion, ear pain, fever, headaches, rhinorrhea, sore throat or sweats. The symptoms are aggravated by lying down and cold air. She has tried a beta-agonist inhaler (antihistamine) for the symptoms. The treatment provided no relief. Her past medical history is significant for asthma.     Problems:  Patient Active Problem List   Diagnosis Date Noted   Bipolar 1 disorder, depressed, mild (HCC) 12/19/2019   Panic attacks 11/04/2019   PTSD (post-traumatic stress disorder) 11/04/2019   GERD (gastroesophageal reflux disease) 12/15/2015   Obesity, morbid, BMI 50 or higher (HCC) 09/30/2015    Allergies: No Known Allergies Medications:  Current Outpatient Medications:    azithromycin (ZITHROMAX) 250 MG tablet, Take 2 tablets on day 1, then  1 tablet daily on days 2 through 5, Disp: 6 tablet, Rfl: 0   predniSONE (DELTASONE) 10 MG tablet, Days 1-4 take 4 tablets (40 mg) daily  Days 5-8 take 3 tablets (30 mg) daily, Days 9-11 take 2 tablets (20 mg) daily, Days 12-14 take 1 tablet (10 mg) daily., Disp: 37 tablet, Rfl: 0   promethazine-dextromethorphan  (PROMETHAZINE-DM) 6.25-15 MG/5ML syrup, Take 5 mLs by mouth 4 (four) times daily as needed., Disp: 118 mL, Rfl: 0   ibuprofen (ADVIL) 800 MG tablet, Take 800 mg by mouth every 8 (eight) hours as needed., Disp: , Rfl:    levalbuterol (XOPENEX HFA) 45 MCG/ACT inhaler, Inhale 2 puffs into the lungs every 4 (four) hours as needed for wheezing., Disp: 1 each, Rfl: 12   montelukast (SINGULAIR) 10 MG tablet, Take 1 tablet (10 mg total) by mouth at bedtime., Disp: 30 tablet, Rfl: 3   Vitamin D, Ergocalciferol, (DRISDOL) 1.25 MG (50000 UNIT) CAPS capsule, Take 1 capsule (50,000 Units total) by mouth every 7 (seven) days. Take for 8 total doses(weeks), Disp: 8 capsule, Rfl: 0  Observations/Objective: Patient is well-developed, well-nourished in no acute distress.  Resting comfortably at home.  Head is normocephalic, atraumatic.  No labored breathing.  Speech is clear and coherent with logical content.  Patient is alert and oriented at baseline.    Assessment and Plan: 1. Acute bacterial bronchitis (Primary) - azithromycin (ZITHROMAX) 250 MG tablet; Take 2 tablets on day 1, then 1 tablet daily on days 2 through 5  Dispense: 6 tablet; Refill: 0 - promethazine-dextromethorphan (PROMETHAZINE-DM) 6.25-15 MG/5ML syrup; Take 5 mLs by mouth 4 (four) times daily as needed.  Dispense: 118 mL; Refill: 0  2. Mild persistent asthma with exacerbation - predniSONE (DELTASONE) 10 MG tablet; Days 1-4 take 4 tablets (40 mg) daily  Days 5-8 take 3 tablets (30 mg) daily, Days 9-11 take 2 tablets (20 mg) daily, Days 12-14 take 1 tablet (10 mg) daily.  Dispense: 37 tablet; Refill: 0  - Worsening over a week despite OTC medications - Will treat with Z-pack and Prednisone - Can add Mucinex  - Promethazine DM for nighttime cough prescribed - Continue inhalers as prescribed - Push fluids.  - Rest.  - Steam and humidifier can help - Seek in person evaluation if worsening or symptoms fail to improve    Follow Up  Instructions: I discussed the assessment and treatment plan with the patient. The patient was provided an opportunity to ask questions and all were answered. The patient agreed with the plan and demonstrated an understanding of the instructions.  A copy of instructions were sent to the patient via MyChart unless otherwise noted below.    The patient was advised to call back or seek an in-person evaluation if the symptoms worsen or if the condition fails to improve as anticipated.    Dorothy Loveless, PA-C

## 2023-11-23 NOTE — Patient Instructions (Signed)
Dorothy Pierce, thank you for joining Margaretann Loveless, PA-C for today's virtual visit.  While this provider is not your primary care provider (PCP), if your PCP is located in our provider database this encounter information will be shared with them immediately following your visit.   A [facility] MyChart account gives you access to today's visit and all your visits, tests, and labs performed at [facility] " click here if you don't have a [facility] MyChart account or go to [email or web address]  Consent: (Patient) Dorothy Pierce provided verbal consent for this virtual visit at the beginning of the encounter.  Current Medications:  Current Outpatient Medications:    azithromycin (ZITHROMAX) 250 MG tablet, Take 2 tablets on day 1, then 1 tablet daily on days 2 through 5, Disp: 6 tablet, Rfl: 0   predniSONE (DELTASONE) 10 MG tablet, Days 1-4 take 4 tablets (40 mg) daily  Days 5-8 take 3 tablets (30 mg) daily, Days 9-11 take 2 tablets (20 mg) daily, Days 12-14 take 1 tablet (10 mg) daily., Disp: 37 tablet, Rfl: 0   promethazine-dextromethorphan (PROMETHAZINE-DM) 6.25-15 MG/5ML syrup, Take 5 mLs by mouth 4 (four) times daily as needed., Disp: 118 mL, Rfl: 0   ibuprofen (ADVIL) 800 MG tablet, Take 800 mg by mouth every 8 (eight) hours as needed., Disp: , Rfl:    levalbuterol (XOPENEX HFA) 45 MCG/ACT inhaler, Inhale 2 puffs into the lungs every 4 (four) hours as needed for wheezing., Disp: 1 each, Rfl: 12   montelukast (SINGULAIR) 10 MG tablet, Take 1 tablet (10 mg total) by mouth at bedtime., Disp: 30 tablet, Rfl: 3   Vitamin D, Ergocalciferol, (DRISDOL) 1.25 MG (50000 UNIT) CAPS capsule, Take 1 capsule (50,000 Units total) by mouth every 7 (seven) days. Take for 8 total doses(weeks), Disp: 8 capsule, Rfl: 0   Medications ordered in this encounter:  Meds ordered this encounter  Medications   azithromycin (ZITHROMAX) 250 MG tablet    Sig: Take 2 tablets on day 1, then 1  tablet daily on days 2 through 5    Dispense:  6 tablet    Refill:  0    Supervising Provider:   Merrilee Jansky [[id number]]   predniSONE (DELTASONE) 10 MG tablet    Sig: Days 1-4 take 4 tablets (40 mg) daily  Days 5-8 take 3 tablets (30 mg) daily, Days 9-11 take 2 tablets (20 mg) daily, Days 12-14 take 1 tablet (10 mg) daily.    Dispense:  37 tablet    Refill:  0    Supervising Provider:   Merrilee Jansky [[id number]]   promethazine-dextromethorphan (PROMETHAZINE-DM) 6.25-15 MG/5ML syrup    Sig: Take 5 mLs by mouth 4 (four) times daily as needed.    Dispense:  118 mL    Refill:  0    Supervising Provider:   Merrilee Jansky [[id number]]     *If you need refills on other medications prior to your next appointment, please contact your pharmacy*  Follow-Up: Call back or seek an in-person evaluation if the symptoms worsen or if the condition fails to improve as anticipated.  [facility] [phone]  Other Instructions Acute Bronchitis, Adult  Acute bronchitis is sudden inflammation of the main airways (bronchi) that come off the windpipe (trachea) in the lungs. The swelling causes the airways to get smaller and make more mucus than normal. This can make it hard to breathe and can cause coughing or noisy breathing (wheezing). Acute bronchitis may  last several weeks. The cough may last longer. Allergies, asthma, and exposure to smoke may make the condition worse. What are the causes? This condition can be caused by germs and by substances that irritate the lungs, including: Cold and flu viruses. The most common cause of this condition is the virus that causes the common cold. Bacteria. This is less common. Breathing in substances that irritate the lungs, including: Smoke from cigarettes and other forms of tobacco. Dust and pollen. Fumes from household cleaning products, gases, or burned fuel. Indoor or outdoor air pollution. What increases the risk? The following factors  may make you more likely to develop this condition: A weak body's defense system, also called the immune system. A condition that affects your lungs and breathing, such as asthma. What are the signs or symptoms? Common symptoms of this condition include: Coughing. This may bring up clear, yellow, or green mucus from your lungs (sputum). Wheezing. Runny or stuffy nose. Having too much mucus in your lungs (chest congestion). Shortness of breath. Aches and pains, including sore throat or chest. How is this diagnosed? This condition is usually diagnosed based on: Your symptoms and medical history. A physical exam. You may also have other tests, including tests to rule out other conditions, such as pneumonia. These tests include: A test of lung function. Test of a mucus sample to look for the presence of bacteria. Tests to check the oxygen level in your blood. Blood tests. Chest X-ray. How is this treated? Most cases of acute bronchitis clear up over time without treatment. Your health care provider may recommend: Drinking more fluids to help thin your mucus so it is easier to cough up. Taking inhaled medicine (inhaler) to improve air flow in and out of your lungs. Using a vaporizer or a humidifier. These are machines that add water to the air to help you breathe better. Taking a medicine that thins mucus and clears congestion (expectorant). Taking a medicine that prevents or stops coughing (cough suppressant). It is not common to take an antibiotic medicine for this condition. Follow these instructions at home:  Take over-the-counter and prescription medicines only as told by your health care provider. Use an inhaler, vaporizer, or humidifier as told by your health care provider. Take two teaspoons (10 mL) of honey at bedtime to lessen coughing at night. Drink enough fluid to keep your urine pale yellow. Do not use any products that contain nicotine or tobacco. These products include  cigarettes, chewing tobacco, and vaping devices, such as e-cigarettes. If you need help quitting, ask your health care provider. Get plenty of rest. Return to your normal activities as told by your health care provider. Ask your health care provider what activities are safe for you. Keep all follow-up visits. This is important. How is this prevented? To lower your risk of getting this condition again: Wash your hands often with soap and water for at least 20 seconds. If soap and water are not available, use hand sanitizer. Avoid contact with people who have cold symptoms. Try not to touch your mouth, nose, or eyes with your hands. Avoid breathing in smoke or chemical fumes. Breathing smoke or chemical fumes will make your condition worse. Get the flu shot every year. Contact a health care provider if: Your symptoms do not improve after 2 weeks. You have trouble coughing up the mucus. Your cough keeps you awake at night. You have a fever. Get help right away if you: Cough up blood. Feel pain in your  chest. Have severe shortness of breath. Faint or keep feeling like you are going to faint. Have a severe headache. Have a fever or chills that get worse. These symptoms may represent a serious problem that is an emergency. Do not wait to see if the symptoms will go away. Get medical help right away. Call your local emergency services (911 in the [place] Do not drive yourself to the hospital. Summary Acute bronchitis is inflammation of the main airways (bronchi) that come off the windpipe (trachea) in the lungs. The swelling causes the airways to get smaller and make more mucus than normal. Drinking more fluids can help thin your mucus so it is easier to cough up. Take over-the-counter and prescription medicines only as told by your health care provider. Do not use any products that contain nicotine or tobacco. These products include cigarettes, chewing tobacco, and vaping devices, such as  e-cigarettes. If you need help quitting, ask your health care provider. Contact a health care provider if your symptoms do not improve after 2 weeks. This information is not intended to replace advice given to you by your health care provider. Make sure you discuss any questions you have with your health care provider. Document Revised: 03/02/2022 Document Reviewed: 03/23/2021 Elsevier Patient Education  2024 Elsevier Inc.    If you have been instructed to have an in-person evaluation today at a local Urgent Care facility, please use the link below. It will take you to a list of all of our available [facility], including address, phone number and hours of operation. Please do not delay care.  [facility]  If you or a family member do not have a primary care provider, use the link below to schedule a visit and establish care. When you choose a [facility] primary care physician or advanced practice provider, you gain a long-term partner in health. Find a Primary Care Provider  Learn more about [facility]'s in-office and virtual care options: [facility] - Get Care Now

## 2023-12-06 DIAGNOSIS — F3132 Bipolar disorder, current episode depressed, moderate: Secondary | ICD-10-CM | POA: Diagnosis not present

## 2023-12-06 DIAGNOSIS — F411 Generalized anxiety disorder: Secondary | ICD-10-CM | POA: Diagnosis not present

## 2023-12-14 DIAGNOSIS — F411 Generalized anxiety disorder: Secondary | ICD-10-CM | POA: Diagnosis not present

## 2023-12-14 DIAGNOSIS — F3132 Bipolar disorder, current episode depressed, moderate: Secondary | ICD-10-CM | POA: Diagnosis not present

## 2023-12-21 ENCOUNTER — Encounter: Payer: BC Managed Care – PPO | Admitting: Family Medicine

## 2024-01-01 ENCOUNTER — Ambulatory Visit (HOSPITAL_COMMUNITY)
Admission: EM | Admit: 2024-01-01 | Discharge: 2024-01-01 | Disposition: A | Discharge status: Home / Self Care | Payer: BC Managed Care – PPO | Attending: Emergency Medicine | Admitting: Emergency Medicine

## 2024-01-01 ENCOUNTER — Emergency Department (HOSPITAL_COMMUNITY)
Admission: EM | Admit: 2024-01-01 | Discharge: 2024-01-02 | Discharge status: Against Medical Advice | Payer: BC Managed Care – PPO | Attending: Emergency Medicine | Admitting: Emergency Medicine

## 2024-01-01 ENCOUNTER — Encounter (HOSPITAL_COMMUNITY): Payer: Self-pay

## 2024-01-01 DIAGNOSIS — M545 Low back pain, unspecified: Secondary | ICD-10-CM | POA: Diagnosis not present

## 2024-01-01 DIAGNOSIS — Z5321 Procedure and treatment not carried out due to patient leaving prior to being seen by health care provider: Secondary | ICD-10-CM | POA: Insufficient documentation

## 2024-01-01 DIAGNOSIS — Z6841 Body Mass Index (BMI) 40.0 and over, adult: Secondary | ICD-10-CM | POA: Diagnosis not present

## 2024-01-01 DIAGNOSIS — M62830 Muscle spasm of back: Secondary | ICD-10-CM | POA: Diagnosis not present

## 2024-01-01 DIAGNOSIS — M549 Dorsalgia, unspecified: Secondary | ICD-10-CM | POA: Insufficient documentation

## 2024-01-01 MED ORDER — CYCLOBENZAPRINE HCL 10 MG PO TABS: 10.0000 mg | ORAL_TABLET | Freq: Two times a day (BID) | ORAL | 0 refills | Status: AC | PRN

## 2024-01-01 NOTE — ED Provider Notes (Signed)
[facility]    CSN: [id number] Arrival date & time: 01/01/24  0813      History   Chief Complaint Chief Complaint  Patient presents with   Back Pain    HPI [name] is a 38 y.o. female.   Dorothy Pierce, 38 year old female, presents to urgent care for evaluation of low back pain.  Patient states that her "back pain came from a kettle bell injury a couple years ago" and "it flares up" on her every now and then.  Patient denies any new injury trauma or fall.  Patient denies saddle numbness, loss of bowel or bladder, loss of function.  Patient has been taking ibuprofen for symptom management. Pt states flexeril helps when she has flare up requesting script.  The history is provided by the patient. No language interpreter was used.    Past Medical History:  Diagnosis Date   Bipolar 1 disorder (HCC)    Obesity (BMI 30.0-34.9)     Patient Active Problem List   Diagnosis Date Noted   Bipolar 1 disorder, depressed, mild (HCC) 12/19/2019   Panic attacks 11/04/2019   PTSD (post-traumatic stress disorder) 11/04/2019   GERD (gastroesophageal reflux disease) 12/15/2015   Obesity, morbid, BMI 50 or higher (HCC) 09/30/2015    History reviewed. No pertinent surgical history.  OB History   No obstetric history on file.      Home Medications    Prior to Admission medications   Medication Sig Start Date End Date Taking? Authorizing Provider  ibuprofen (ADVIL) 800 MG tablet Take 800 mg by mouth every 8 (eight) hours as needed. 05/02/23   [provider]  levalbuterol Pauline Aus HFA) 45 MCG/ACT inhaler Inhale 2 puffs into the lungs every 4 (four) hours as needed for wheezing. 05/04/23   Suzan Slick, MD  montelukast (SINGULAIR) 10 MG tablet Take 1 tablet (10 mg total) by mouth at bedtime. 11/23/23   Suzan Slick, MD  predniSONE (DELTASONE) 10 MG tablet Days 1-4 take 4 tablets (40 mg) daily  Days 5-8 take 3 tablets (30 mg) daily, Days 9-11 take 2  tablets (20 mg) daily, Days 12-14 take 1 tablet (10 mg) daily. 11/23/23   Margaretann Loveless, PA-C  promethazine-dextromethorphan (PROMETHAZINE-DM) 6.25-15 MG/5ML syrup Take 5 mLs by mouth 4 (four) times daily as needed. 11/23/23   Margaretann Loveless, PA-C  Vitamin D, Ergocalciferol, (DRISDOL) 1.25 MG (50000 UNIT) CAPS capsule Take 1 capsule (50,000 Units total) by mouth every 7 (seven) days. Take for 8 total doses(weeks) 11/23/23   Suzan Slick, MD    Family History Family History  Problem Relation Age of Onset   Hypertension Mother    Depression Mother    Hyperlipidemia Mother    Thyroid disease Mother    Allergies Sister    Depression Sister    Asthma Sister    Hypertension Maternal Aunt    Hyperlipidemia Maternal Aunt    Arthritis Maternal Aunt    Cancer Maternal Aunt        skin/ breast   Hypertension Maternal Uncle    Hyperlipidemia Maternal Uncle    Hypertension Maternal Grandmother    Hyperlipidemia Maternal Grandmother    Asthma Maternal Grandmother    Diabetes Maternal Grandmother    Hypertension Maternal Grandfather    Hyperlipidemia Maternal Grandfather    Diabetes Maternal Grandfather     Social History Social History   Tobacco Use   Smoking status: Former    Passive exposure: Never  Smokeless tobacco: Never  Vaping Use   Vaping status: Some Days  Substance Use Topics   Alcohol use: Yes    Alcohol/week: 0.0 standard drinks of alcohol    Comment: rarely. history of addiction   Drug use: Yes    Types: Marijuana    Comment: former marijuana     Allergies   Patient has no known allergies.   Review of Systems Review of Systems  Constitutional:  Negative for fever.  Musculoskeletal:  Positive for back pain and myalgias. Negative for gait problem.  All other systems reviewed and are negative.    Physical Exam Triage Vital Signs ED Triage Vitals  Encounter Vitals Group     BP 01/01/24 0841 128/80     Systolic BP Percentile --       Diastolic BP Percentile --      Pulse Rate 01/01/24 0841 78     Resp 01/01/24 0841 16     Temp 01/01/24 0841 98.2 F (36.8 C)     Temp Source 01/01/24 0841 Oral     SpO2 01/01/24 0841 96 %     Weight --      Height --      Head Circumference --      Peak Flow --      Pain Score 01/01/24 0840 7     Pain Loc --      Pain Education --      Exclude from Growth Chart --    No data found.  Updated Vital Signs BP 128/80 (BP Location: Left Arm)   Pulse 78   Temp 98.2 F (36.8 C) (Oral)   Resp 16   LMP 12/26/2023 (Approximate)   SpO2 96%   Visual Acuity Right Eye Distance:   Left Eye Distance:   Bilateral Distance:    Right Eye Near:   Left Eye Near:    Bilateral Near:     Physical Exam Vitals and nursing note reviewed.  Constitutional:      General: She is not in acute distress.    Appearance: She is well-developed and well-groomed. She is morbidly obese.     Comments: BMI is 70.9  HENT:     Head: Normocephalic and atraumatic.  Eyes:     Conjunctiva/sclera: Conjunctivae normal.  Cardiovascular:     Rate and Rhythm: Normal rate and regular rhythm.     Pulses: Normal pulses.          Dorsalis pedis pulses are 2+ on the right side and 2+ on the left side.     Heart sounds: No murmur heard. Pulmonary:     Effort: Pulmonary effort is normal. No respiratory distress.  Abdominal:     Palpations: Abdomen is soft.     Tenderness: There is no abdominal tenderness.  Musculoskeletal:        General: No swelling.     Cervical back: Neck supple.     Lumbar back: Spasms and tenderness present. No swelling, edema, deformity or signs of trauma. Negative right straight leg raise test and negative left straight leg raise test.     Comments: +TTP of right paraspinal lumbar muscles  Skin:    General: Skin is warm and dry.     Capillary Refill: Capillary refill takes less than 2 seconds.  Neurological:     Mental Status: She is alert.  Psychiatric:        Mood and Affect: Mood  normal.        Behavior: Behavior is  cooperative.      UC Treatments / Results  Labs (all labs ordered are listed, but only abnormal results are displayed) Labs Reviewed - No data to display  EKG   Radiology No results found.  Procedures Procedures (including critical care time)  Medications Ordered in UC Medications - No data to display  Initial Impression / Assessment and Plan / UC Course  I have reviewed the triage vital signs and the nursing notes.  Pertinent labs & imaging results that were available during my care of the patient were reviewed by me and considered in my medical decision making (see chart for details).    Discussed exam findings and plan of care with patient, strict go to ER precautions given, take Flexeril as prescribed may cause drowsiness.  With PCP may need referral to physical therapy ,patient verbalized understanding this provider  Ddx: Low back pain, muscle spasm, morbid obesity Final Clinical Impressions(s) / UC Diagnoses   Final diagnoses:  None   Discharge Instructions   None    ED Prescriptions   None    PDMP not reviewed this encounter.   Clancy Gourd, NP 01/01/24 [phone]

## 2024-01-01 NOTE — ED Triage Notes (Addendum)
Pt states lower back pain for the past 2 denies any injury. States she has been taking ibuprofen at home with some relief.

## 2024-01-01 NOTE — ED Triage Notes (Signed)
Pt is coming in for acute on chronic back pain, a few years ago she was doing kettle bell swings and threw her back out, she does have flare ups every now and then and she usually has to come to the ER for steroids. She mentions the pain does get worse when she walks or does any movement and there is some burning in her hips but no unilateral or bilateral leg weakness or numbness. She is otherwise stable with no other complaints at this time.

## 2024-01-01 NOTE — Discharge Instructions (Addendum)
Take Flexeril as prescribed , may cause drowsiness ,take home meds as directed. May use heat or ice to back for comfort. May use lidocaine patch or biofreeze for pain. Please follow up with PCP, may need to referral to physical therapy for further back pain management.   GO immediately to nearest ER or call [id number] for loss of bowel and bladder,loss of function, saddle numbness, etc.   Pt advised otc tylenol as label directed for pain  Avoid lifting,turning,bending as this will aggravate your back

## 2024-01-02 ENCOUNTER — Ambulatory Visit (INDEPENDENT_AMBULATORY_CARE_PROVIDER_SITE_OTHER): Payer: BC Managed Care – PPO

## 2024-01-02 ENCOUNTER — Encounter (HOSPITAL_COMMUNITY): Payer: Self-pay | Admitting: Emergency Medicine

## 2024-01-02 ENCOUNTER — Ambulatory Visit (HOSPITAL_COMMUNITY)
Admission: EM | Admit: 2024-01-02 | Discharge: 2024-01-02 | Disposition: A | Discharge status: Home / Self Care | Payer: BC Managed Care – PPO | Attending: Sports Medicine | Admitting: Sports Medicine

## 2024-01-02 DIAGNOSIS — M25551 Pain in right hip: Secondary | ICD-10-CM

## 2024-01-02 DIAGNOSIS — M5416 Radiculopathy, lumbar region: Secondary | ICD-10-CM | POA: Diagnosis not present

## 2024-01-02 DIAGNOSIS — M25552 Pain in left hip: Secondary | ICD-10-CM

## 2024-01-02 DIAGNOSIS — Z0189 Encounter for other specified special examinations: Secondary | ICD-10-CM | POA: Diagnosis not present

## 2024-01-02 MED ORDER — KETOROLAC TROMETHAMINE 30 MG/ML IJ SOLN
30.0000 mg | Freq: Once | INTRAMUSCULAR | Status: AC
  Administered 2024-01-02: 30 mg via INTRAMUSCULAR

## 2024-01-02 MED ORDER — PREDNISONE 10 MG (21) PO TBPK: ORAL_TABLET | Freq: Every day | ORAL | 0 refills | Status: AC

## 2024-01-02 MED ORDER — METHYLPREDNISOLONE SODIUM SUCC 125 MG IJ SOLR
INTRAMUSCULAR | Status: AC
  Filled 2024-01-02: qty 2

## 2024-01-02 MED ORDER — KETOROLAC TROMETHAMINE 30 MG/ML IJ SOLN
INTRAMUSCULAR | Status: AC
  Filled 2024-01-02: qty 1

## 2024-01-02 MED ORDER — METHYLPREDNISOLONE SODIUM SUCC 125 MG IJ SOLR
80.0000 mg | Freq: Once | INTRAMUSCULAR | Status: AC
  Administered 2024-01-02: 80 mg via INTRAMUSCULAR

## 2024-01-02 NOTE — Discharge Instructions (Addendum)
In order to optimize your pain management and function we will use a multidirectional approach for treatment  To address the pain directly: Use heat or ice, whichever is more effective for you  To address the pain systemically:  We gave you a shot of Depomedrol (steroid) and Toradol (NSAID) in office today.  I have sent you a prednisone taper to take over the next 6 days. You can take Tylenol 500mg  1-2 tablets up to three times a day. Start with the lowest dose and frequency and work your way up until you have pain control. Do not exceed 1000mg  three times a day. Inbetween Tylenol doses you can use Naproxen 500mg  twice a day, Ibuprofen 200-800mg  twice a day, or Maloxicam 15mg  once in the morning. Whichever you choose take this with food and drink plenty of water.   To address the underlying dysfunction:  Start gentle range of motion exercises.  Follow the printed material for rehab strengthening If this is not effective you may benefit from formal physical therapy  For best chances of long lasting pain control it is best to use multiple of these therapies together rather than trying each alone.

## 2024-01-02 NOTE — ED Provider Notes (Signed)
[facility]    CSN: [id number] Arrival date & time: 01/02/24  0908      History   Chief Complaint Chief Complaint  Patient presents with   Back Pain    HPI Dorothy Pierce is a 38 y.o. female here for evaluation of bilateral low back pain with radiation down both of her legs to the knee.  She was here yesterday for evaluation and discharged with Flexeril though states that she did this has not been helpful for her.  Currently rates her pain at an 8 to a 9 out of 10.  She also endorses groin pain bilaterally with motion of her hips.  She has had issues with back pain dating back a few years from a kettle bell injury and states that every once in a while it flares up for her.  She had no new injury prior to these onset of back pain.  She denies any lower extremity weakness, numbness, loss of bladder or bowel control.   Back Pain   Past Medical History:  Diagnosis Date   Bipolar 1 disorder (HCC)    Obesity (BMI 30.0-34.9)     Patient Active Problem List   Diagnosis Date Noted   Acute right-sided low back pain without sciatica 01/01/2024   Muscle spasm of back 01/01/2024   Bipolar 1 disorder, depressed, mild (HCC) 12/19/2019   Panic attacks 11/04/2019   PTSD (post-traumatic stress disorder) 11/04/2019   GERD (gastroesophageal reflux disease) 12/15/2015   Morbid obesity with BMI of 70 and over, adult (HCC) 09/30/2015    History reviewed. No pertinent surgical history.  OB History   No obstetric history on file.      Home Medications    Prior to Admission medications   Medication Sig Start Date End Date Taking? Authorizing Provider  cyclobenzaprine (FLEXERIL) 10 MG tablet Take 1 tablet (10 mg total) by mouth 2 (two) times daily as needed for muscle spasms. 01/01/24  Yes Defelice, Para March, NP  ibuprofen (ADVIL) 800 MG tablet Take 800 mg by mouth every 8 (eight) hours as needed. 05/02/23  Yes [provider]  levalbuterol Pauline Aus HFA) 45 MCG/ACT  inhaler Inhale 2 puffs into the lungs every 4 (four) hours as needed for wheezing. 05/04/23  Yes Rucker, Magdalen Spatz, MD  montelukast (SINGULAIR) 10 MG tablet Take 1 tablet (10 mg total) by mouth at bedtime. 11/23/23  Yes Rucker, Magdalen Spatz, MD  predniSONE (STERAPRED UNI-PAK 21 TAB) 10 MG (21) TBPK tablet Take by mouth daily for 6 days. Take 6 tabs by mouth daily  for 1 days, then 5 tabs for 1 days, then 4 tabs for 1 days, then 3 tabs for 1 days, 2 tabs for 1 days, then 1 tab by mouth daily for 1 days 01/02/24 01/08/24 Yes Marisa Cyphers, MD  promethazine-dextromethorphan (PROMETHAZINE-DM) 6.25-15 MG/5ML syrup Take 5 mLs by mouth 4 (four) times daily as needed. 11/23/23  Yes Margaretann Loveless, PA-C  Vitamin D, Ergocalciferol, (DRISDOL) 1.25 MG (50000 UNIT) CAPS capsule Take 1 capsule (50,000 Units total) by mouth every 7 (seven) days. Take for 8 total doses(weeks) 11/23/23  Yes Rucker, Magdalen Spatz, MD    Family History Family History  Problem Relation Age of Onset   Hypertension Mother    Depression Mother    Hyperlipidemia Mother    Thyroid disease Mother    Allergies Sister    Depression Sister    Asthma Sister    Hypertension Maternal Aunt    Hyperlipidemia Maternal  Aunt    Arthritis Maternal Aunt    Cancer Maternal Aunt        skin/ breast   Hypertension Maternal Uncle    Hyperlipidemia Maternal Uncle    Hypertension Maternal Grandmother    Hyperlipidemia Maternal Grandmother    Asthma Maternal Grandmother    Diabetes Maternal Grandmother    Hypertension Maternal Grandfather    Hyperlipidemia Maternal Grandfather    Diabetes Maternal Grandfather     Social History Social History   Tobacco Use   Smoking status: Former    Passive exposure: Never   Smokeless tobacco: Never  Vaping Use   Vaping status: Some Days  Substance Use Topics   Alcohol use: Yes    Alcohol/week: 0.0 standard drinks of alcohol    Comment: rarely. history of addiction   Drug use: Yes    Types:  Marijuana    Comment: former marijuana     Allergies   Patient has no known allergies.   Review of Systems Review of Systems  Musculoskeletal:  Positive for back pain.     Physical Exam Triage Vital Signs ED Triage Vitals  Encounter Vitals Group     BP 01/02/24 1054 (!) 146/95     Systolic BP Percentile --      Diastolic BP Percentile --      Pulse Rate 01/02/24 1054 78     Resp 01/02/24 1054 18     Temp 01/02/24 1054 98 F (36.7 C)     Temp Source 01/02/24 1054 Oral     SpO2 01/02/24 1054 96 %     Weight 01/02/24 1056 (!) 380 lb (172.4 kg)     Height 01/02/24 1056 5\' 5"  (1.651 m)     Head Circumference --      Peak Flow --      Pain Score 01/02/24 1056 8     Pain Loc --      Pain Education --      Exclude from Growth Chart --    No data found.  Updated Vital Signs BP (!) 146/95 (BP Location: Left Arm)   Pulse 78   Temp 98 F (36.7 C) (Oral)   Resp 18   Ht 5\' 5"  (1.651 m)   Wt (!) 172.4 kg   LMP 12/26/2023 (Approximate)   SpO2 96%   BMI 63.24 kg/m   Visual Acuity Right Eye Distance:   Left Eye Distance:   Bilateral Distance:    Right Eye Near:   Left Eye Near:    Bilateral Near:     Physical Exam Constitutional:      General: She is not in acute distress.    Appearance: Normal appearance. She is obese. She is not toxic-appearing.  Musculoskeletal:     Comments: Back/Hips MSK Exam: No gross deformity, scoliosis, exam limited by body habitus. TTP bilateral lumbar paraspinals.  No midline bony TTP, No SI joint TTP, No glute TTP. ROM of lumbar spine with flexion and extension 2/2 pain. Hip ROM also limited. Strength LEs 5/5 all muscle groups.   2+ MSRs in patellar and achilles tendons, equal bilaterally. Positive SLRs bilaterally. Positive stinchfield on the left. Negative FABER/FADIR right, +FADIR on left. Sensation intact to light touch bilaterally.    Neurological:     Mental Status: She is alert.      UC Treatments / Results   Labs (all labs ordered are listed, but only abnormal results are displayed) Labs Reviewed - No data to display  EKG  Radiology No results found.  Procedures Procedures (including critical care time)  Medications Ordered in UC Medications  methylPREDNISolone sodium succinate (SOLU-MEDROL) 125 mg/2 mL injection 80 mg (80 mg Intramuscular Given 01/02/24 1224)  ketorolac (TORADOL) 30 MG/ML injection 30 mg (30 mg Intramuscular Given 01/02/24 1225)    Initial Impression / Assessment and Plan / UC Course  I have reviewed the triage vital signs and the nursing notes.  Pertinent labs & imaging results that were available during my care of the patient were reviewed by me and considered in my medical decision making (see chart for details).    Vitals and triage reviewed, patient is hemodynamically stable.  Bilateral lumbar radiculopathy left greater than right without any red flag findings on history or exam.  I did get x-rays of both her lumbar spine and bilateral hips as she had some impingement testing positive on the left hip though joint space appears okay and no acute bony abnormalities noted on my impression -formal radiology read pending, will update patient if any pertinent findings noted. She was provided an IM dose of Depo-Medrol 80 mg and 30 mg of Toradol in urgent care today and discharged with a 6-day prednisone taper.  Advised her to work on home exercise program regarding her low back and continuation of Tylenol and ibuprofen as needed for pain.  Recommended orthopedic follow-up if pain persist.  Patient's questions were answered and she is in agreement this plan.  Final Clinical Impressions(s) / UC Diagnoses   Final diagnoses:  Lumbar radiculopathy, acute  Bilateral hip pain     Discharge Instructions      In order to optimize your pain management and function we will use a multidirectional approach for treatment  To address the pain directly: Use heat or ice,  whichever is more effective for you  To address the pain systemically:  We gave you a shot of Depomedrol (steroid) and Toradol (NSAID) in office today.  I have sent you a prednisone taper to take over the next 6 days. You can take Tylenol 500mg  1-2 tablets up to three times a day. Start with the lowest dose and frequency and work your way up until you have pain control. Do not exceed 1000mg  three times a day. Inbetween Tylenol doses you can use Naproxen 500mg  twice a day, Ibuprofen 200-800mg  twice a day, or Maloxicam 15mg  once in the morning. Whichever you choose take this with food and drink plenty of water.   To address the underlying dysfunction:  Start gentle range of motion exercises.  Follow the printed material for rehab strengthening If this is not effective you may benefit from formal physical therapy  For best chances of long lasting pain control it is best to use multiple of these therapies together rather than trying each alone.    ED Prescriptions     Medication Sig Dispense Auth. Provider   predniSONE (STERAPRED UNI-PAK 21 TAB) 10 MG (21) TBPK tablet Take by mouth daily for 6 days. Take 6 tabs by mouth daily  for 1 days, then 5 tabs for 1 days, then 4 tabs for 1 days, then 3 tabs for 1 days, 2 tabs for 1 days, then 1 tab by mouth daily for 1 days 21 tablet Mi Balla, [place] Calix, MD      PDMP not reviewed this encounter.   Marisa Cyphers, MD 01/02/24 [phone]

## 2024-01-02 NOTE — ED Notes (Signed)
Patient left.

## 2024-01-02 NOTE — ED Triage Notes (Signed)
Patient c/o low back pain x 3 days.  No new injury.  Patient was seen yesterday and given Flexeril w/o any relief.  Denies any urinary sx's.

## 2024-01-05 DIAGNOSIS — F411 Generalized anxiety disorder: Secondary | ICD-10-CM | POA: Diagnosis not present

## 2024-01-05 DIAGNOSIS — F3132 Bipolar disorder, current episode depressed, moderate: Secondary | ICD-10-CM | POA: Diagnosis not present

## 2024-01-10 ENCOUNTER — Encounter: Payer: BC Managed Care – PPO | Admitting: Family Medicine

## 2024-01-10 ENCOUNTER — Other Ambulatory Visit: Payer: Self-pay | Admitting: Family Medicine

## 2024-01-10 DIAGNOSIS — F411 Generalized anxiety disorder: Secondary | ICD-10-CM | POA: Diagnosis not present

## 2024-01-10 DIAGNOSIS — F3132 Bipolar disorder, current episode depressed, moderate: Secondary | ICD-10-CM | POA: Diagnosis not present

## 2024-01-17 ENCOUNTER — Encounter: Payer: BC Managed Care – PPO | Admitting: Family Medicine

## 2024-01-18 ENCOUNTER — Ambulatory Visit: Payer: BC Managed Care – PPO | Admitting: Obstetrics & Gynecology

## 2024-03-06 DIAGNOSIS — F411 Generalized anxiety disorder: Secondary | ICD-10-CM | POA: Diagnosis not present

## 2024-03-06 DIAGNOSIS — F3132 Bipolar disorder, current episode depressed, moderate: Secondary | ICD-10-CM | POA: Diagnosis not present

## 2024-04-03 DIAGNOSIS — F411 Generalized anxiety disorder: Secondary | ICD-10-CM | POA: Diagnosis not present

## 2024-04-03 DIAGNOSIS — F3132 Bipolar disorder, current episode depressed, moderate: Secondary | ICD-10-CM | POA: Diagnosis not present

## 2024-05-01 DIAGNOSIS — F411 Generalized anxiety disorder: Secondary | ICD-10-CM | POA: Diagnosis not present

## 2024-05-01 DIAGNOSIS — F3132 Bipolar disorder, current episode depressed, moderate: Secondary | ICD-10-CM | POA: Diagnosis not present

## 2024-05-26 ENCOUNTER — Encounter: Payer: BC Managed Care – PPO | Admitting: Family Medicine

## 2024-05-31 ENCOUNTER — Ambulatory Visit (HOSPITAL_COMMUNITY)
Admission: EM | Admit: 2024-05-31 | Discharge: 2024-05-31 | Disposition: A | Discharge status: Home / Self Care | Attending: Physician Assistant | Admitting: Physician Assistant

## 2024-05-31 ENCOUNTER — Encounter (HOSPITAL_COMMUNITY): Payer: Self-pay

## 2024-05-31 DIAGNOSIS — M5441 Lumbago with sciatica, right side: Secondary | ICD-10-CM | POA: Diagnosis not present

## 2024-05-31 MED ORDER — KETOROLAC TROMETHAMINE 30 MG/ML IJ SOLN
INTRAMUSCULAR | Status: AC
  Filled 2024-05-31: qty 1

## 2024-05-31 MED ORDER — PREDNISONE 20 MG PO TABS: 40.0000 mg | ORAL_TABLET | Freq: Every day | ORAL | 0 refills | Status: AC

## 2024-05-31 MED ORDER — KETOROLAC TROMETHAMINE 30 MG/ML IJ SOLN
30.0000 mg | Freq: Once | INTRAMUSCULAR | Status: AC
  Administered 2024-05-31: 30 mg via INTRAMUSCULAR

## 2024-05-31 NOTE — Discharge Instructions (Addendum)
 Recommend ice to affected area and gentle stretching. I have sent in a steroid Dosepak for you to take over the next 5 days. If no improvement or symptoms become worse please return for evaluation.

## 2024-05-31 NOTE — ED Provider Notes (Signed)
 [facility]    CSN: [id number] Arrival date & time: 05/31/24  1021      History   Chief Complaint Chief Complaint  Patient presents with   Back Pain    HPI Dorothy Pierce is a 38 y.o. female.   Patient presents with right-sided lower back pain that started about 2 days ago.  She reports she was getting up from sitting when she fell sudden onset of right lower back pain.  She reports some radiation to her right buttocks.  Denies lower extremity numbness or tingling, denies saddle anesthesia or loss of bowel or bladder control.  She reports similar episode in January of this year which resolved with Toradol  shot in clinic and prednisone  course.  She has tried muscle relaxers in the past with no relief.  She has been trying ibuprofen over this last 2 days with minimal relief.  She has also been trying some gentle stretching which has helped somewhat.    Past Medical History:  Diagnosis Date   Bipolar 1 disorder (HCC)    Obesity (BMI 30.0-34.9)     Patient Active Problem List   Diagnosis Date Noted   Acute right-sided low back pain without sciatica 01/01/2024   Muscle spasm of back 01/01/2024   Bipolar 1 disorder, depressed, mild (HCC) 12/19/2019   Panic attacks 11/04/2019   PTSD (post-traumatic stress disorder) 11/04/2019   GERD (gastroesophageal reflux disease) 12/15/2015   Morbid obesity with BMI of 70 and over, adult (HCC) 09/30/2015    History reviewed. No pertinent surgical history.  OB History   No obstetric history on file.      Home Medications    Prior to Admission medications   Medication Sig Start Date End Date Taking? Authorizing Provider  cyclobenzaprine  (FLEXERIL ) 10 MG tablet Take 1 tablet (10 mg total) by mouth 2 (two) times daily as needed for muscle spasms. 01/01/24  Yes Defelice, Rilla, NP  hydrOXYzine  (ATARAX ) 10 MG tablet Take 10 mg by mouth 2 (two) times daily as needed. 01/06/24  Yes [provider]  ibuprofen (ADVIL) 800  MG tablet Take 800 mg by mouth every 8 (eight) hours as needed. 05/02/23  Yes [provider]  levalbuterol  (XOPENEX  HFA) 45 MCG/ACT inhaler Inhale 2 puffs into the lungs every 4 (four) hours as needed for wheezing. 05/04/23  Yes Rucker, Torrence GRADE, MD  montelukast  (SINGULAIR ) 10 MG tablet Take 1 tablet (10 mg total) by mouth at bedtime. 11/23/23  Yes Rucker, Torrence GRADE, MD  predniSONE  (DELTASONE ) 20 MG tablet Take 2 tablets (40 mg total) by mouth daily for 5 days. 05/31/24 06/05/24 Yes Ward, Harlene PEDLAR, PA-C  promethazine -dextromethorphan (PROMETHAZINE -DM) 6.25-15 MG/5ML syrup Take 5 mLs by mouth 4 (four) times daily as needed. 11/23/23  Yes Burnette, Delon M, PA-C  TOPAMAX 50 MG tablet Take 50 mg by mouth daily. 01/06/24  Yes [provider]  Vitamin D , Ergocalciferol , (DRISDOL ) 1.25 MG (50000 UNIT) CAPS capsule Take 1 capsule (50,000 Units total) by mouth every 7 (seven) days. Take for 8 total doses(weeks) 11/23/23  Yes Rucker, Torrence GRADE, MD    Family History Family History  Problem Relation Age of Onset   Hypertension Mother    Depression Mother    Hyperlipidemia Mother    Thyroid  disease Mother    Allergies Sister    Depression Sister    Asthma Sister    Hypertension Maternal Aunt    Hyperlipidemia Maternal Aunt    Arthritis Maternal Aunt    Cancer  Maternal Aunt        skin/ breast   Hypertension Maternal Uncle    Hyperlipidemia Maternal Uncle    Hypertension Maternal Grandmother    Hyperlipidemia Maternal Grandmother    Asthma Maternal Grandmother    Diabetes Maternal Grandmother    Hypertension Maternal Grandfather    Hyperlipidemia Maternal Grandfather    Diabetes Maternal Grandfather     Social History Social History   Tobacco Use   Smoking status: Former    Passive exposure: Never   Smokeless tobacco: Never  Vaping Use   Vaping status: Former  Substance Use Topics   Alcohol use: Not Currently    Comment: rarely. history of addiction   Drug use: Yes     Types: Marijuana    Comment: former marijuana     Allergies   Patient has no known allergies.   Review of Systems Review of Systems  Constitutional:  Negative for chills and fever.  HENT:  Negative for ear pain and sore throat.   Eyes:  Negative for pain and visual disturbance.  Respiratory:  Negative for cough and shortness of breath.   Cardiovascular:  Negative for chest pain and palpitations.  Gastrointestinal:  Negative for abdominal pain and vomiting.  Genitourinary:  Negative for dysuria and hematuria.  Musculoskeletal:  Positive for back pain. Negative for arthralgias.  Skin:  Negative for color change and rash.  Neurological:  Negative for seizures and syncope.  All other systems reviewed and are negative.    Physical Exam Triage Vital Signs ED Triage Vitals  Encounter Vitals Group     BP 05/31/24 1123 134/82     Girls Systolic BP Percentile --      Girls Diastolic BP Percentile --      Boys Systolic BP Percentile --      Boys Diastolic BP Percentile --      Pulse Rate 05/31/24 1123 70     Resp 05/31/24 1123 17     Temp 05/31/24 1123 98.2 F (36.8 C)     Temp Source 05/31/24 1123 Oral     SpO2 05/31/24 1123 95 %     Weight 05/31/24 1120 (!) 424 lb (192.3 kg)     Height 05/31/24 1120 5' 5 (1.651 m)     Head Circumference --      Peak Flow --      Pain Score 05/31/24 1120 7     Pain Loc --      Pain Education --      Exclude from Growth Chart --    No data found.  Updated Vital Signs BP 134/82 (BP Location: Right Arm)   Pulse 70   Temp 98.2 F (36.8 C) (Oral)   Resp 17   Ht 5' 5 (1.651 m)   Wt (!) 424 lb (192.3 kg)   LMP 05/28/2024   SpO2 95%   BMI 70.56 kg/m   Visual Acuity Right Eye Distance:   Left Eye Distance:   Bilateral Distance:    Right Eye Near:   Left Eye Near:    Bilateral Near:     Physical Exam Vitals and nursing note reviewed.  Constitutional:      General: She is not in acute distress.    Appearance: She is  well-developed.  HENT:     Head: Normocephalic and atraumatic.   Eyes:     Conjunctiva/sclera: Conjunctivae normal.    Cardiovascular:     Rate and Rhythm: Normal rate and regular rhythm.  Heart sounds: No murmur heard. Pulmonary:     Effort: Pulmonary effort is normal. No respiratory distress.     Breath sounds: Normal breath sounds.  Abdominal:     Palpations: Abdomen is soft.     Tenderness: There is no abdominal tenderness.   Musculoskeletal:        General: No swelling.     Cervical back: Neck supple.     Comments: Right lumbar paraspinal muscular TTP, right positive straight leg raise.  No midline tenderness.   Skin:    General: Skin is warm and dry.     Capillary Refill: Capillary refill takes less than 2 seconds.   Neurological:     Mental Status: She is alert.   Psychiatric:        Mood and Affect: Mood normal.      UC Treatments / Results  Labs (all labs ordered are listed, but only abnormal results are displayed) Labs Reviewed - No data to display  EKG   Radiology No results found.  Procedures Procedures (including critical care time)  Medications Ordered in UC Medications  ketorolac  (TORADOL ) 30 MG/ML injection 30 mg (has no administration in time range)    Initial Impression / Assessment and Plan / UC Course  I have reviewed the triage vital signs and the nursing notes.  Pertinent labs & imaging results that were available during my care of the patient were reviewed by me and considered in my medical decision making (see chart for details).     Right lower lumbar pain.  Toradol  given in clinic today.  Prednisone  course prescribed.  Supportive care discussed.  No red flag symptoms in clinic today.  Return precautions discussed. Final Clinical Impressions(s) / UC Diagnoses   Final diagnoses:  Acute right-sided low back pain with right-sided sciatica     Discharge Instructions      Recommend ice to affected area and gentle  stretching. I have sent in a steroid Dosepak for you to take over the next 5 days. If no improvement or symptoms become worse please return for evaluation.   ED Prescriptions     Medication Sig Dispense Auth. Provider   predniSONE  (DELTASONE ) 20 MG tablet Take 2 tablets (40 mg total) by mouth daily for 5 days. 10 tablet Ward, Amyrah Pinkhasov Z, PA-C      PDMP not reviewed this encounter.   Ward, Harlene PEDLAR, PA-C 05/31/24 1152

## 2024-05-31 NOTE — ED Triage Notes (Signed)
 Pt states that she has lower back pain. X2 days  Pt states that she has a history of back pain.

## 2024-06-17 ENCOUNTER — Telehealth: Payer: Self-pay

## 2024-06-17 NOTE — Telephone Encounter (Signed)
 Copied from CRM [id number]. Topic: Appointments - Transfer of Care >> Jun 17, 2024  9:25 AM Frederich PARAS wrote: Pt is requesting to transfer FROM: primary care at [facility]  Pt is requesting to transfer TO: [facility]    Reason for requested transfer: new provider It is the responsibility of the team the patient would like to transfer to (Dr. Tillman ) to reach out to the patient if for any reason this transfer is not acceptable.

## 2024-06-20 ENCOUNTER — Ambulatory Visit: Admitting: Internal Medicine

## 2024-07-08 ENCOUNTER — Ambulatory Visit: Admitting: Nurse Practitioner

## 2024-07-27 ENCOUNTER — Other Ambulatory Visit: Payer: Self-pay | Admitting: Family Medicine

## 2024-07-27 DIAGNOSIS — R062 Wheezing: Secondary | ICD-10-CM

## 2024-08-16 ENCOUNTER — Encounter: Admitting: Obstetrics & Gynecology

## 2024-09-17 ENCOUNTER — Encounter (INDEPENDENT_AMBULATORY_CARE_PROVIDER_SITE_OTHER): Payer: Self-pay

## 2024-09-30 ENCOUNTER — Encounter (INDEPENDENT_AMBULATORY_CARE_PROVIDER_SITE_OTHER): Payer: Self-pay | Admitting: Family Medicine

## 2024-09-30 ENCOUNTER — Ambulatory Visit (INDEPENDENT_AMBULATORY_CARE_PROVIDER_SITE_OTHER): Admitting: Family Medicine

## 2024-09-30 VITALS — BP 134/77 | HR 75 | Temp 97.5°F | Ht 65.0 in | Wt >= 6400 oz

## 2024-09-30 DIAGNOSIS — F3131 Bipolar disorder, current episode depressed, mild: Secondary | ICD-10-CM | POA: Diagnosis not present

## 2024-09-30 DIAGNOSIS — Z6841 Body Mass Index (BMI) 40.0 and over, adult: Secondary | ICD-10-CM

## 2024-09-30 DIAGNOSIS — K219 Gastro-esophageal reflux disease without esophagitis: Secondary | ICD-10-CM | POA: Diagnosis not present

## 2024-09-30 DIAGNOSIS — Z8249 Family history of ischemic heart disease and other diseases of the circulatory system: Secondary | ICD-10-CM

## 2024-09-30 NOTE — Progress Notes (Signed)
 Barnie DOROTHA Jenkins, DO, ABFM, ABOM Bariatric physician [address] [place], [place], [place] [address] Office: [phone]  /  Fax: [phone]     Initial Evaluation:  Dorothy Pierce was seen in clinic today to evaluate for obesity. She is interested in losing weight to improve overall health and reduce the risk of weight related complications. She presents today to review program treatment options, initial physical assessment, and evaluation.      She was referred by: PCP  When asked what they hope to accomplish? She states: accountability partner, improve quality of life and consistency lifestyle change.   When asked how has your weight affected you? She states: Contributed to medical problems, Contributed to orthopedic problems or mobility issues, Having fatigue, and Other: sleep issues  Contributing factors to her weight change: family history of obesity, disruption of circadian rhythm / sleep disordered breathing, consumption of processed foods, reduced physical activity, and problems with eating patterns  Some associated conditions: GERD and Lung disease  Current nutrition plan: None  Current level of physical activity: Other: cross fit 60 min 3 days a week  Current or previous pharmacotherapy: None  Pt was put on Topiramate for Bipolar but pt had cold lips and was in a zombie state.   Response to medication: Never tried medications   Barriers to weight loss that patient expresses a concern about today: lack of time for self-care, having difficulty preparing healthy meals, having difficulty with meal prep and planning, orthopedic problems, medical conditions or chronic pain affecting mobility, and presence of obesogenic drugs.     Past Medical History:  Diagnosis Date   Bipolar 1 disorder (HCC)    Obesity (BMI 30.0-34.9)     Current Outpatient Medications  Medication Instructions   ARIPiprazole  (ABILIFY ) 5 mg   cyclobenzaprine  (FLEXERIL ) 10 mg, Oral, 2 times daily  PRN   hydrOXYzine  (ATARAX ) 10 mg, 2 times daily PRN   ibuprofen (ADVIL) 800 mg, Every 8 hours PRN   levalbuterol  (XOPENEX  HFA) 45 MCG/ACT inhaler 2 puffs, Inhalation, Every 4 hours PRN   montelukast  (SINGULAIR ) 10 mg, Oral, Daily at bedtime   promethazine -dextromethorphan (PROMETHAZINE -DM) 6.25-15 MG/5ML syrup 5 mLs, Oral, 4 times daily PRN   Topamax 50 mg, Daily   Vitamin D  (Ergocalciferol ) (DRISDOL ) 50,000 Units, Oral, Every 7 days, Take for 8 total doses(weeks)     No Known Allergies   History reviewed. No pertinent surgical history.   Family History  Problem Relation Age of Onset   Hypertension Mother    Depression Mother    Hyperlipidemia Mother    Thyroid  disease Mother    Allergies Sister    Depression Sister    Asthma Sister    Hypertension Maternal Aunt    Hyperlipidemia Maternal Aunt    Arthritis Maternal Aunt    Cancer Maternal Aunt        skin/ breast   Hypertension Maternal Uncle    Hyperlipidemia Maternal Uncle    Hypertension Maternal Grandmother    Hyperlipidemia Maternal Grandmother    Asthma Maternal Grandmother    Diabetes Maternal Grandmother    Hypertension Maternal Grandfather    Hyperlipidemia Maternal Grandfather    Diabetes Maternal Grandfather      Objective:  BP 134/77   Pulse 75   Temp (!) 97.5 F (36.4 C)   Ht 5' 5 (1.651 m)   Wt (!) 423 lb (191.9 kg)   LMP 09/21/2024   SpO2 97%   BMI 70.39 kg/m  She was weighed on the  bioimpedance scale: Body mass index is 70.39 kg/m.  Visceral Fat rating : 30, Body Fat %:62  Weight Lost Since Last Visit: 0  Weight Gained Since Last Visit: 0    Vitals Temp: (!) 97.5 F (36.4 C) BP: 134/77 Pulse Rate: 75 SpO2: 97 %   Anthropometric Measurements Height: 5' 5 (1.651 m) Weight: (!) 423 lb (191.9 kg) BMI (Calculated): 70.39 Weight at Last Visit: 0 Weight Lost Since Last Visit: 0 Weight Gained Since Last Visit: 0 Starting Weight: 0 Total Weight Loss (lbs): 0 lb (0 kg) Peak Weight:  430lb   Body Composition  Body Fat %: 62 % Fat Mass (lbs): 262.6 lbs Muscle Mass (lbs): 152.8 lbs Visceral Fat Rating : 30   Other Clinical Data Fasting: no Labs: no Today's Visit #: Consult Comments: Consult     General: Well Developed, well nourished, and in no acute distress.  HEENT: Normocephalic, atraumatic; EOMI, sclerae are anicteric. Skin: Warm and dry, good turgor Chest:  Normal excursion, shape, no gross ABN Respiratory: No conversational dyspnea; speaking in full sentences NeuroM-Sk:  Normal gross ROM * 4 extremities  Psych: A and O *3, insight adequate, mood- full    Assessment and Plan:   FOR THE DISEASE OF OBESITY:  Morbid obesity with BMI of 70 and over, adult [facility]) Assessment & Plan: We reviewed anthropometrics, biometrics, associated medical conditions and contributing factors with patient. Dorothy Pierce would benefit from a medically tailored reduced calorie nutrional plan based on their REE (resting energy expenditure), which will be determined by indirect calorimetry.  We will also assess for cardiometabolic risk and nutritional derangements via fasting labs at intake appointment.    Obesity Treatment / Action Plan:   she was weighed on the bioimpedance scale and results were discussed and documented in the synopsis.   Dorothy Pierce will complete provided nutritional and psychosocial assessment questionnaire before the next appointment.  she will be scheduled for indirect calorimetry to determine resting energy expenditure in a fasting state.  This will allow us  to create a reduced calorie, high-protein meal plan to promote loss of fat mass while preserving muscle mass.  We will also assess for cardiometabolic risk and nutritional derangements via an ECG and fasting serologies at her next appointment.  she was encouraged to work on amassing support from family and friends to begin their weight loss journey.   Work on eliminating or reducing the  presence of highly processed, poorly nutritious, calorie-dense foods in the home.   Obesity Education Performed Today:  Patient was counseled on nutritional approaches to weight loss and benefits of reducing processed foods and consuming plant-based foods and high quality protein as part of nutritional weight management program.   We discussed the importance of long term lifestyle changes which include nutrition, exercise and behavioral modifications as well as the importance of customizing this to her specific health and social needs.   We discussed the benefits of reaching a healthier weight to alleviate the symptoms of existing conditions and reduce the risks of the biomechanical, metabolic and psychological effects of obesity.  Was counseled on the health benefits of losing 5%-10% of total body weight.  Was counseled on our cognitive behavorial therapy program, lead by our bariatric psychologist, who focuses on emotional eating and creating positive behavorial change.  Was counseled on bariatric pharmacotherapy and how this may be used as an adjunct in their weight management    Dorothy Pierce appears to be in the action stage of change and states they are  ready to start intensive lifestyle modifications and behavioral modifications.  It was recommended that she follow up in the next 1-2 weeks to review the above steps, and to continue with treatment of their chronic disease state of obesity   FOR OTHER CONDITIONS RELATED TO THE DISEASE OF OBESITY:  Bipolar 1 disorder, depressed, mild (HCC) Assessment & Plan Pt has a history Bipolar 1 disorder. She states that she has previously seen several doctors but feel like they did not listen to her concerns and started her on medications that she did not stay on for long. She recently established care with a psychiatrist and is taking Abilify  currently for about a month and a half. She endorses seeing them once a month. She states her mood and  symptoms are well controlled. Reports that despite medication adjustments, her appetite and eating behaviors have not improved. She has attempted multiple dietary interventions including ketogenic diet and intermittent fasting, with no sustained weight loss.     Gastroesophageal reflux disease, unspecified whether esophagitis present Assessment & Plan Pt states that she does not take any prescribed medications for GERD. She takes OTC Prilosec prn. She endorses that before COVID in 2018 she lost weight with intermittent fasting her GERD went away and she slept better. She states that her symptoms are well controlled and has not needed to take any medications in awhile.     Family history of early CAD Assessment & Plan Pt has a family history of CAD. Her father had a heart attack about 2 years ago at age 40. She denies her father having any history of diabetes but states that her mother has history of DM. Explained to pt how she would benefit from joining our program.     Attestations:   I, Sonny Laroche, acting as a medical scribe for Barnie Jenkins, DO., have compiled all relevant documentation for today's office visit on behalf of Barnie Jenkins, DO, while in the presence of [employer], DO.  I have spent 49 minutes in the care of the patient today.  39 minutes was spent in face to face counseling of the patient on the disease of obesity and what our program can do for their medical conditions as well as in preventing future diseases. I discussed the importance of comprehensive care in the treatment of obesity including mental well being and physical activity. 10 minutes was spent on pre-chart review and additional post visit documentation.   I have reviewed the above documentation for accuracy and completeness, and I agree with the above. Barnie JINNY Jenkins, D.O.  The 21st Century Cures Act was signed into law in 2016 which includes the topic of electronic health records.  This  provides immediate access to information in MyChart.  This includes consultation notes, operative notes, office notes, lab results and pathology reports.  If you have any questions about what you read please let us  know at your next visit so we can discuss your concerns and take corrective action if need be.  We are right here with you!

## 2024-10-09 ENCOUNTER — Telehealth: Payer: Self-pay | Admitting: Diagnostic Neuroimaging

## 2024-10-09 NOTE — Telephone Encounter (Signed)
 Received sleep referral for patient from [facility], NP. DX obesity, snoring disorder, asthma, req. Sleep study. Placed in sleep referrals box
